# Patient Record
Sex: Male | Born: 1980 | Race: White | Hispanic: No | Marital: Married | Smoking: Former smoker
Health system: Southern US, Community
[De-identification: ages and names within clinical notes are randomized; demographics above are authoritative.]

## PROBLEM LIST (undated history)

## (undated) DIAGNOSIS — G43909 Migraine, unspecified, not intractable, without status migrainosus: Secondary | ICD-10-CM

## (undated) DIAGNOSIS — R519 Headache, unspecified: Secondary | ICD-10-CM

## (undated) DIAGNOSIS — G8929 Other chronic pain: Secondary | ICD-10-CM

## (undated) DIAGNOSIS — Z91018 Allergy to other foods: Secondary | ICD-10-CM

## (undated) DIAGNOSIS — F329 Major depressive disorder, single episode, unspecified: Secondary | ICD-10-CM

## (undated) DIAGNOSIS — J45909 Unspecified asthma, uncomplicated: Secondary | ICD-10-CM

## (undated) DIAGNOSIS — Z87448 Personal history of other diseases of urinary system: Secondary | ICD-10-CM

## (undated) DIAGNOSIS — F419 Anxiety disorder, unspecified: Secondary | ICD-10-CM

## (undated) DIAGNOSIS — H9319 Tinnitus, unspecified ear: Secondary | ICD-10-CM

## (undated) DIAGNOSIS — F1990 Other psychoactive substance use, unspecified, uncomplicated: Secondary | ICD-10-CM

## (undated) DIAGNOSIS — G4730 Sleep apnea, unspecified: Secondary | ICD-10-CM

## (undated) DIAGNOSIS — R569 Unspecified convulsions: Secondary | ICD-10-CM

## (undated) DIAGNOSIS — E663 Overweight: Secondary | ICD-10-CM

## (undated) DIAGNOSIS — F32A Depression, unspecified: Secondary | ICD-10-CM

## (undated) DIAGNOSIS — M549 Dorsalgia, unspecified: Secondary | ICD-10-CM

## (undated) DIAGNOSIS — F319 Bipolar disorder, unspecified: Secondary | ICD-10-CM

## (undated) DIAGNOSIS — J4520 Mild intermittent asthma, uncomplicated: Secondary | ICD-10-CM

## (undated) DIAGNOSIS — R0602 Shortness of breath: Secondary | ICD-10-CM

## (undated) DIAGNOSIS — R002 Palpitations: Secondary | ICD-10-CM

## (undated) DIAGNOSIS — R55 Syncope and collapse: Secondary | ICD-10-CM

## (undated) DIAGNOSIS — K219 Gastro-esophageal reflux disease without esophagitis: Secondary | ICD-10-CM

## (undated) DIAGNOSIS — T7840XA Allergy, unspecified, initial encounter: Secondary | ICD-10-CM

## (undated) HISTORY — PX: VASECTOMY: SHX75

## (undated) HISTORY — DX: Bipolar disorder, unspecified: F31.9

## (undated) HISTORY — DX: Major depressive disorder, single episode, unspecified: F32.9

## (undated) HISTORY — DX: Palpitations: R00.2

## (undated) HISTORY — DX: Gastro-esophageal reflux disease without esophagitis: K21.9

## (undated) HISTORY — DX: Sleep apnea, unspecified: G47.30

## (undated) HISTORY — DX: Headache, unspecified: R51.9

## (undated) HISTORY — PX: APPENDECTOMY: SHX54

## (undated) HISTORY — DX: Migraine, unspecified, not intractable, without status migrainosus: G43.909

## (undated) HISTORY — DX: Unspecified asthma, uncomplicated: J45.909

## (undated) HISTORY — DX: Tinnitus, unspecified ear: H93.19

## (undated) HISTORY — DX: Allergy to other foods: Z91.018

## (undated) HISTORY — DX: Syncope and collapse: R55

## (undated) HISTORY — DX: Personal history of other diseases of urinary system: Z87.448

## (undated) HISTORY — DX: Other psychoactive substance use, unspecified, uncomplicated: F19.90

## (undated) HISTORY — PX: WISDOM TOOTH EXTRACTION: SHX21

## (undated) HISTORY — DX: Mild intermittent asthma, uncomplicated: J45.20

## (undated) HISTORY — DX: Overweight: E66.3

## (undated) HISTORY — DX: Shortness of breath: R06.02

## (undated) HISTORY — DX: Unspecified convulsions: R56.9

## (undated) HISTORY — DX: Other chronic pain: G89.29

## (undated) HISTORY — DX: Depression, unspecified: F32.A

## (undated) HISTORY — PX: CHOLECYSTECTOMY: SHX55

## (undated) HISTORY — DX: Allergy, unspecified, initial encounter: T78.40XA

## (undated) HISTORY — DX: Anxiety disorder, unspecified: F41.9

## (undated) HISTORY — PX: NASAL SEPTUM SURGERY: SHX37

## (undated) HISTORY — DX: Dorsalgia, unspecified: M54.9

---

## 2000-04-29 ENCOUNTER — Ambulatory Visit (HOSPITAL_COMMUNITY): Admission: RE | Admit: 2000-04-29 | Discharge: 2000-05-01 | Payer: Self-pay | Admitting: Internal Medicine

## 2001-05-08 ENCOUNTER — Encounter: Payer: Self-pay | Admitting: Internal Medicine

## 2001-05-08 ENCOUNTER — Encounter: Admission: RE | Admit: 2001-05-08 | Discharge: 2001-05-08 | Payer: Self-pay | Admitting: Internal Medicine

## 2001-05-19 ENCOUNTER — Encounter: Payer: Self-pay | Admitting: Urology

## 2001-05-19 ENCOUNTER — Encounter: Admission: RE | Admit: 2001-05-19 | Discharge: 2001-05-19 | Payer: Self-pay | Admitting: Urology

## 2014-07-06 DIAGNOSIS — G43709 Chronic migraine without aura, not intractable, without status migrainosus: Secondary | ICD-10-CM | POA: Insufficient documentation

## 2014-07-06 DIAGNOSIS — N200 Calculus of kidney: Secondary | ICD-10-CM | POA: Insufficient documentation

## 2014-07-06 DIAGNOSIS — N201 Calculus of ureter: Secondary | ICD-10-CM | POA: Insufficient documentation

## 2014-07-06 DIAGNOSIS — J019 Acute sinusitis, unspecified: Secondary | ICD-10-CM | POA: Insufficient documentation

## 2014-07-06 DIAGNOSIS — F988 Other specified behavioral and emotional disorders with onset usually occurring in childhood and adolescence: Secondary | ICD-10-CM | POA: Insufficient documentation

## 2014-07-06 DIAGNOSIS — J309 Allergic rhinitis, unspecified: Secondary | ICD-10-CM | POA: Insufficient documentation

## 2014-07-06 DIAGNOSIS — E785 Hyperlipidemia, unspecified: Secondary | ICD-10-CM | POA: Insufficient documentation

## 2015-01-24 DIAGNOSIS — R748 Abnormal levels of other serum enzymes: Secondary | ICD-10-CM | POA: Insufficient documentation

## 2015-01-24 HISTORY — PX: ESOPHAGOGASTRODUODENOSCOPY: SHX1529

## 2015-01-24 HISTORY — PX: FLEXIBLE SIGMOIDOSCOPY: SHX1649

## 2015-01-26 DIAGNOSIS — K209 Esophagitis, unspecified without bleeding: Secondary | ICD-10-CM | POA: Insufficient documentation

## 2015-01-31 DIAGNOSIS — G43019 Migraine without aura, intractable, without status migrainosus: Secondary | ICD-10-CM | POA: Insufficient documentation

## 2015-01-31 DIAGNOSIS — K2990 Gastroduodenitis, unspecified, without bleeding: Secondary | ICD-10-CM | POA: Insufficient documentation

## 2015-03-27 DIAGNOSIS — M542 Cervicalgia: Secondary | ICD-10-CM | POA: Insufficient documentation

## 2015-04-24 DIAGNOSIS — J343 Hypertrophy of nasal turbinates: Secondary | ICD-10-CM | POA: Insufficient documentation

## 2015-04-24 DIAGNOSIS — J342 Deviated nasal septum: Secondary | ICD-10-CM | POA: Insufficient documentation

## 2016-09-03 ENCOUNTER — Other Ambulatory Visit (HOSPITAL_BASED_OUTPATIENT_CLINIC_OR_DEPARTMENT_OTHER): Payer: Self-pay

## 2016-09-03 ENCOUNTER — Other Ambulatory Visit (HOSPITAL_BASED_OUTPATIENT_CLINIC_OR_DEPARTMENT_OTHER): Payer: Self-pay | Admitting: Osteopathic Medicine

## 2016-09-03 DIAGNOSIS — M542 Cervicalgia: Secondary | ICD-10-CM

## 2016-09-09 ENCOUNTER — Ambulatory Visit (HOSPITAL_BASED_OUTPATIENT_CLINIC_OR_DEPARTMENT_OTHER)
Admission: RE | Admit: 2016-09-09 | Discharge: 2016-09-09 | Disposition: A | Discharge status: Home / Self Care | Payer: BLUE CROSS/BLUE SHIELD | Source: Ambulatory Visit | Attending: Osteopathic Medicine | Admitting: Osteopathic Medicine

## 2016-09-09 DIAGNOSIS — M542 Cervicalgia: Secondary | ICD-10-CM

## 2016-12-23 DIAGNOSIS — R569 Unspecified convulsions: Secondary | ICD-10-CM | POA: Insufficient documentation

## 2017-02-10 ENCOUNTER — Other Ambulatory Visit (HOSPITAL_BASED_OUTPATIENT_CLINIC_OR_DEPARTMENT_OTHER): Payer: Self-pay | Admitting: Physician Assistant

## 2017-02-10 DIAGNOSIS — R222 Localized swelling, mass and lump, trunk: Secondary | ICD-10-CM

## 2017-02-20 ENCOUNTER — Ambulatory Visit (HOSPITAL_BASED_OUTPATIENT_CLINIC_OR_DEPARTMENT_OTHER)
Admission: RE | Admit: 2017-02-20 | Discharge: 2017-02-20 | Disposition: A | Discharge status: Home / Self Care | Payer: Managed Care, Other (non HMO) | Source: Ambulatory Visit | Attending: Physician Assistant | Admitting: Physician Assistant

## 2017-02-20 ENCOUNTER — Ambulatory Visit (HOSPITAL_BASED_OUTPATIENT_CLINIC_OR_DEPARTMENT_OTHER): Admission: RE | Admit: 2017-02-20 | Payer: Managed Care, Other (non HMO) | Source: Ambulatory Visit

## 2017-02-20 DIAGNOSIS — R221 Localized swelling, mass and lump, neck: Secondary | ICD-10-CM | POA: Diagnosis present

## 2017-02-20 DIAGNOSIS — R222 Localized swelling, mass and lump, trunk: Secondary | ICD-10-CM

## 2017-04-01 ENCOUNTER — Other Ambulatory Visit (HOSPITAL_BASED_OUTPATIENT_CLINIC_OR_DEPARTMENT_OTHER): Payer: Self-pay | Admitting: Physician Assistant

## 2017-04-01 DIAGNOSIS — R229 Localized swelling, mass and lump, unspecified: Principal | ICD-10-CM

## 2017-04-01 DIAGNOSIS — IMO0002 Reserved for concepts with insufficient information to code with codable children: Secondary | ICD-10-CM

## 2017-04-03 ENCOUNTER — Ambulatory Visit (HOSPITAL_BASED_OUTPATIENT_CLINIC_OR_DEPARTMENT_OTHER)
Admission: RE | Admit: 2017-04-03 | Discharge: 2017-04-03 | Disposition: A | Discharge status: Home / Self Care | Payer: Managed Care, Other (non HMO) | Source: Ambulatory Visit | Attending: Physician Assistant | Admitting: Physician Assistant

## 2017-04-03 DIAGNOSIS — IMO0002 Reserved for concepts with insufficient information to code with codable children: Secondary | ICD-10-CM

## 2017-04-03 DIAGNOSIS — R229 Localized swelling, mass and lump, unspecified: Secondary | ICD-10-CM | POA: Insufficient documentation

## 2017-04-03 MED ORDER — IOPAMIDOL (ISOVUE-300) INJECTION 61%
100.0000 mL | Freq: Once | INTRAVENOUS | Status: AC | PRN
  Administered 2017-04-03: 75 mL via INTRAVENOUS

## 2018-01-08 DIAGNOSIS — R42 Dizziness and giddiness: Secondary | ICD-10-CM | POA: Diagnosis not present

## 2018-01-09 ENCOUNTER — Ambulatory Visit (INDEPENDENT_AMBULATORY_CARE_PROVIDER_SITE_OTHER): Payer: Managed Care, Other (non HMO) | Admitting: Family Medicine

## 2018-01-09 ENCOUNTER — Encounter: Payer: Self-pay | Admitting: Family Medicine

## 2018-01-09 VITALS — BP 124/80 | HR 97 | Ht 73.0 in | Wt 290.4 lb

## 2018-01-09 DIAGNOSIS — Z0001 Encounter for general adult medical examination with abnormal findings: Secondary | ICD-10-CM | POA: Insufficient documentation

## 2018-01-09 DIAGNOSIS — F339 Major depressive disorder, recurrent, unspecified: Secondary | ICD-10-CM | POA: Diagnosis not present

## 2018-01-09 DIAGNOSIS — G43709 Chronic migraine without aura, not intractable, without status migrainosus: Secondary | ICD-10-CM

## 2018-01-09 DIAGNOSIS — IMO0002 Reserved for concepts with insufficient information to code with codable children: Secondary | ICD-10-CM | POA: Insufficient documentation

## 2018-01-09 MED ORDER — VENLAFAXINE HCL 37.5 MG PO TABS: 37.5000 mg | ORAL_TABLET | Freq: Two times a day (BID) | ORAL | 0 refills | Status: DC

## 2018-01-09 MED ORDER — DULOXETINE HCL 20 MG PO CPEP: ORAL_CAPSULE | ORAL | 0 refills | Status: DC
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
## 2018-01-09 NOTE — Patient Instructions (Signed)
Health Maintenance, Male A healthy lifestyle and preventive care is important for your health and wellness. Ask your health care provider about what schedule of regular examinations is right for you. What should I know about weight and diet? Eat a Healthy Diet  Eat plenty of vegetables, fruits, whole grains, low-fat dairy products, and lean protein.  Do not eat a lot of foods high in solid fats, added sugars, or salt.  Maintain a Healthy Weight Regular exercise can help you achieve or maintain a healthy weight. You should:  Do at least 150 minutes of exercise each week. The exercise should increase your heart rate and make you sweat (moderate-intensity exercise).  Do strength-training exercises at least twice a week.  Watch Your Levels of Cholesterol and Blood Lipids  Have your blood tested for lipids and cholesterol every 5 years starting at 37 years of age. If you are at high risk for heart disease, you should start having your blood tested when you are 37 years old. You may need to have your cholesterol levels checked more often if: ? Your lipid or cholesterol levels are high. ? You are older than 37 years of age. ? You are at high risk for heart disease.  What should I know about cancer screening? Many types of cancers can be detected early and may often be prevented. Lung Cancer  You should be screened every year for lung cancer if: ? You are a current smoker who has smoked for at least 30 years. ? You are a former smoker who has quit within the past 15 years.  Talk to your health care provider about your screening options, when you should start screening, and how often you should be screened.  Colorectal Cancer  Routine colorectal cancer screening usually begins at 37 years of age and should be repeated every 5-10 years until you are 37 years old. You may need to be screened more often if early forms of precancerous polyps or small growths are found. Your health care provider  may recommend screening at an earlier age if you have risk factors for colon cancer.  Your health care provider may recommend using home test kits to check for hidden blood in the stool.  A small camera at the end of a tube can be used to examine your colon (sigmoidoscopy or colonoscopy). This checks for the earliest forms of colorectal cancer.  Prostate and Testicular Cancer  Depending on your age and overall health, your health care provider may do certain tests to screen for prostate and testicular cancer.  Talk to your health care provider about any symptoms or concerns you have about testicular or prostate cancer.  Skin Cancer  Check your skin from head to toe regularly.  Tell your health care provider about any new moles or changes in moles, especially if: ? There is a change in a mole's size, shape, or color. ? You have a mole that is larger than a pencil eraser.  Always use sunscreen. Apply sunscreen liberally and repeat throughout the day.  Protect yourself by wearing long sleeves, pants, a wide-brimmed hat, and sunglasses when outside.  What should I know about heart disease, diabetes, and high blood pressure?  If you are 89-13 years of age, have your blood pressure checked every 3-5 years. If you are 70 years of age or older, have your blood pressure checked every year. You should have your blood pressure measured twice-once when you are at a hospital or clinic, and once when  you are not at a hospital or clinic. Record the average of the two measurements. To check your blood pressure when you are not at a hospital or clinic, you can use: ? An automated blood pressure machine at a pharmacy. ? A home blood pressure monitor.  Talk to your health care provider about your target blood pressure.  If you are between 18-11 years old, ask your health care provider if you should take aspirin to prevent heart disease.  Have regular diabetes screenings by checking your fasting blood  sugar level. ? If you are at a normal weight and have a low risk for diabetes, have this test once every three years after the age of 70. ? If you are overweight and have a high risk for diabetes, consider being tested at a younger age or more often.  A one-time screening for abdominal aortic aneurysm (AAA) by ultrasound is recommended for men aged 65-75 years who are current or former smokers. What should I know about preventing infection? Hepatitis B If you have a higher risk for hepatitis B, you should be screened for this virus. Talk with your health care provider to find out if you are at risk for hepatitis B infection. Hepatitis C Blood testing is recommended for:  Everyone born from [birth year] through 1965.  Anyone with known risk factors for hepatitis C.  Sexually Transmitted Diseases (STDs)  You should be screened each year for STDs including gonorrhea and chlamydia if: ? You are sexually active and are younger than 37 years of age. ? You are older than 37 years of age and your health care provider tells you that you are at risk for this type of infection. ? Your sexual activity has changed since you were last screened and you are at an increased risk for chlamydia or gonorrhea. Ask your health care provider if you are at risk.  Talk with your health care provider about whether you are at high risk of being infected with HIV. Your health care provider may recommend a prescription medicine to help prevent HIV infection.  What else can I do?  Schedule regular health, dental, and eye exams.  Stay current with your vaccines (immunizations).  Do not use any tobacco products, such as cigarettes, chewing tobacco, and e-cigarettes. If you need help quitting, ask your health care provider.  Limit alcohol intake to no more than 2 drinks per day. One drink equals 12 ounces of beer, 5 ounces of wine, or 1 ounces of hard liquor.  Do not use street drugs.  Do not share needles.  Ask your  health care provider for help if you need support or information about quitting drugs.  Tell your health care provider if you often feel depressed.  Tell your health care provider if you have ever been abused or do not feel safe at home. This information is not intended to replace advice given to you by your health care provider. Make sure you discuss any questions you have with your health care provider. Document Released: 03/21/2008 Document Revised: 05/22/2016 Document Reviewed: 06/27/2015 Elsevier Interactive Patient Education  2018 [employer].  How to Increase Your Level of Physical Activity Getting regular physical activity is important for your overall health and well-being. Most people do not get enough exercise. There are easy ways to increase your level of physical activity, even if you have not been very active in the past or you are just starting out. Why is physical activity important? Physical activity has many  short-term and long-term health benefits. Regular exercise can:  Help you lose weight or maintain a healthy weight.  Strengthen your muscles and bones.  Boost your mood and improve self-esteem.  Reduce your risk of certain long-term (chronic) diseases, like heart disease, cancer, and diabetes.  Help you stay capable of walking and moving around (mobile) as you age.  Prevent accidents, such as falls, as you age.  Increase life expectancy.  What are the benefits of being physically active on a regular basis? In addition to improving your physical health, being physically active on most days of the week can help you in ways that you may not expect. Benefits of regular physical activity may include:  Feeling good about your body.  Being able to move around more easily and for longer periods of time without getting tired (increased stamina).  Finding new sources of fun and enjoyment.  Meeting new people who share a common interest.  Being able to fight off  illness better (enhanced immunity).  Being able to sleep better.  What can happen if I am not physically active on a regular basis? Not getting enough physical activity can lead to an unhealthy lifestyle and future health problems. This can increase your chances of:  Becoming overweight or obese.  Becoming sick.  Developing chronic illnesses, like heart disease or diabetes.  Having mental health problems, like depression or anxiety.  Having sleep problems.  Having trouble walking or getting yourself around (reduced mobility).  Injuring yourself in a fall as you get older.  What steps can I take to be more physically active?  Check with your health care provider about how to get started. Ask your health care provider what activities are safe for you.  Start out slowly. Walking or doing some simple chair exercises is a good place to start, especially if you have not been active before or for a long time.  Try to find activities that you enjoy. You are more likely to commit to an exercise routine if it does not feel like a chore.  If you have bone or joint problems, choose low-impact exercises, like walking or swimming.  Include physical activity in your everyday routine.  Invite friends or family members to exercise with you. This also will help you commit to your workout plan.  Set goals that you can work toward.  Aim for at least 150 minutes of moderate-intensity exercise each week. Examples of moderate-intensity exercise include walking or riding a bike. Where to find more information:  Centers for Disease Control and Prevention: [employer]is  President's [employer] on [employer], Sports & Nutrition www.http://villegas.org/  ChooseMyPlate: [employer] Contact a health care provider if:  You have headaches, muscle aches, or joint pain.  You feel dizzy or light-headed while exercising.  You faint.  You have  chest pain while exercising. Summary  Exercise benefits your mind and body at any age, even if you are just starting out.  If you have a chronic illness or have not been active for a while, check with your health care provider before increasing your physical activity.  Choose activities that are safe and enjoyable for you.Ask your health care provider what activities are safe for you.  Start slowly. Tell your health care provider if you have problems as you start to increase your activity level. This information is not intended to replace advice given to you by your health care provider. Make sure you discuss any questions you have with your health care provider. Document Released:  09/12/2016 Document Revised: 09/12/2016 Document Reviewed: 09/12/2016 Elsevier Interactive Patient Education  2018 [employer].  Persistent Depressive Disorder, Adult Persistent depressive disorder (PDD) is a mental health condition that causes symptoms of low-level depression for 2 years or longer. It may also be called long-term (chronic) depression or dysthymia. PDD may include episodes of more severe depression that last for about 2 weeks (major depressive disorder or MDD). PDD can affect the way you think, feel, and sleep. This condition may also affect your relationships. You may be more likely to get sick if you have PDD. What are the causes? The exact cause of this condition is not known. PDD is most likely caused by a combination of things, which may include:  Genetic factors. These are traits that are passed along from parent to child.  Individual factors. Your personality, your behavior, and the way you handle your thoughts and feelings may contribute to PDD. This includes personality traits and behaviors learned from others.  Physical factors, such as: ? Differences in the part of your brain that controls emotion. This part of your brain may be different than it is in people who do not have  PDD. ? Long-term (chronic) medical or psychiatric illnesses.  Social factors. Traumatic experiences or major life changes may play a role in the development of PDD.  What increases the risk? This condition is more likely to develop in women. The following factors may make you more likely to develop PDD:  A family history of depression.  Abnormally low levels of certain brain chemicals.  Traumatic events in childhood, especially abuse or the loss of a parent.  Being under a lot of stress, or long-term stress, especially from upsetting life experiences or losses.  A history of: ? Chronic physical illness. ? Other mental health disorders. ? Substance abuse.  Poor living conditions.  Experiencing social exclusion or discrimination on a regular basis.  What are the signs or symptoms? Symptoms of this condition occur for most of the day, and may include:  Fatigue or low energy.  Eating too much or too little.  Sleeping too much or too little.  Restlessness or agitation.  Feelings of hopelessness.  Feeling worthless or guilty.  Anxiety.  Poor concentration or difficulty making decisions.  Low self-esteem.  Negative outlook.  Inability to have fun or experience pleasure.  Social withdrawal.  Unexplained physical complaints.  Irritability.  Aggressive behavior or anger.  How is this diagnosed? This condition may be diagnosed based on:  Your symptoms.  Your medical history, including your mental health history. This may involve tests to evaluate your mental health. You may be asked questions about your lifestyle, including any drug and alcohol use, and how long you have had symptoms of PDD.  A physical exam.  Blood tests to rule out other conditions.  You may be diagnosed with PDD if you have had a depressed mood for 2 years or longer, as well as other symptoms of depression. How is this treated? This condition is usually treated by mental health  professionals, such as psychologists, psychiatrists, and clinical social workers. You may need more than one type of treatment. Treatment may include:  Psychotherapy. This is also called talk therapy or counseling. Types of psychotherapy include: ? Cognitive behavioral therapy (CBT). This type of therapy teaches you to recognize unhealthy feelings, thoughts, and behaviors, and replace them with positive thoughts and actions. ? Interpersonal therapy (IPT). This helps you to improve the way you relate to and communicate with  others. ? Family therapy. This treatment includes members of your family.  Medicine to treat anxiety and depression, or to help you control certain emotions and behaviors.  Lifestyle changes, such as: ? Limiting alcohol and drug use. ? Exercising regularly. ? Getting plenty of sleep. ? Making healthy eating choices. ? Spending more time outdoors.  Follow these instructions at home: Activity  Return to your normal activities as told by your health care provider.  Exercise regularly and spend time outdoors as told by your health care provider. General instructions  Take over-the-counter and prescription medicines only as told by your health care provider.  Do not drink alcohol. If you drink alcohol, limit your alcohol intake to no more than 1 drink a day for nonpregnant women and 2 drinks a day for men. One drink equals 12 oz of beer, 5 oz of wine, or 1 oz of hard liquor. Alcohol can affect any antidepressant medicines you are taking. Talk to your health care provider about your alcohol use.  Eat a healthy diet and get plenty of sleep.  Find activities that you enjoy doing, and make time to do them.  Consider joining a support group. Your health care provider may be able to recommend a support group.  Keep all follow-up visits as told by your health care provider. This is important. Where to find more information: [employer] on Mental  Illness  www.nami.org  U.S. National Institute of Mental Health  https://carter.com/  National Suicide Prevention Lifeline  1-800-273-TALK (216)084-5823). This is free, 24-hour help.  Contact a health care provider if:  Your symptoms get worse.  You develop new symptoms.  You have trouble sleeping or doing your daily activities. Get help right away if:  You self-harm.  You have serious thoughts about hurting yourself or others.  You see, hear, taste, smell, or feel things that are not present (hallucinate). This information is not intended to replace advice given to you by your health care provider. Make sure you discuss any questions you have with your health care provider. Document Released: 09/09/2012 Document Revised: 05/23/2016 Document Reviewed: 04/06/2016 Elsevier Interactive Patient Education  2018 [place] 18-39 Years, Male Preventive care refers to lifestyle choices and visits with your health care provider that can promote health and wellness. What does preventive care include?  A yearly physical exam. This is also called an annual well check.  Dental exams once or twice a year.  Routine eye exams. Ask your health care provider how often you should have your eyes checked.  Personal lifestyle choices, including: ? Daily care of your teeth and gums. ? Regular physical activity. ? Eating a healthy diet. ? Avoiding tobacco and drug use. ? Limiting alcohol use. ? Practicing safe sex. What happens during an annual well check? The services and screenings done by your health care provider during your annual well check will depend on your age, overall health, lifestyle risk factors, and family history of disease. Counseling Your health care provider may ask you questions about your:  Alcohol use.  Tobacco use.  Drug use.  Emotional well-being.  Home and relationship well-being.  Sexual activity.  Eating habits.  Work and work  Statistician.  Screening You may have the following tests or measurements:  Height, weight, and BMI.  Blood pressure.  Lipid and cholesterol levels. These may be checked every 5 years starting at age 4.  Diabetes screening. This is done by checking your blood sugar (glucose) after you have not  eaten for a while (fasting).  Skin check.  Hepatitis C blood test.  Hepatitis B blood test.  Sexually transmitted disease (STD) testing.  Discuss your test results, treatment options, and if necessary, the need for more tests with your health care provider. Vaccines Your health care provider may recommend certain vaccines, such as:  Influenza vaccine. This is recommended every year.  Tetanus, diphtheria, and acellular pertussis (Tdap, Td) vaccine. You may need a Td booster every 10 years.  Varicella vaccine. You may need this if you have not been vaccinated.  HPV vaccine. If you are 74 or younger, you may need three doses over 6 months.  Measles, mumps, and rubella (MMR) vaccine. You may need at least one dose of MMR.You may also need a second dose.  Pneumococcal 13-valent conjugate (PCV13) vaccine. You may need this if you have certain conditions and have not been vaccinated.  Pneumococcal polysaccharide (PPSV23) vaccine. You may need one or two doses if you smoke cigarettes or if you have certain conditions.  Meningococcal vaccine. One dose is recommended if you are age 21-21 years and a first-year college student living in a residence hall, or if you have one of several medical conditions. You may also need additional booster doses.  Hepatitis A vaccine. You may need this if you have certain conditions or if you travel or work in places where you may be exposed to hepatitis A.  Hepatitis B vaccine. You may need this if you have certain conditions or if you travel or work in places where you may be exposed to hepatitis B.  Haemophilus influenzae type b (Hib) vaccine. You may need  this if you have certain risk factors.  Talk to your health care provider about which screenings and vaccines you need and how often you need them. This information is not intended to replace advice given to you by your health care provider. Make sure you discuss any questions you have with your health care provider. Document Released: 11/19/2001 Document Revised: 06/12/2016 Document Reviewed: 07/25/2015 Elsevier Interactive Patient Education  [employer].

## 2018-01-14 ENCOUNTER — Other Ambulatory Visit: Payer: Self-pay

## 2018-01-14 ENCOUNTER — Encounter: Payer: Self-pay | Admitting: Family Medicine

## 2018-01-14 MED ORDER — DULOXETINE HCL 30 MG PO CPEP: ORAL_CAPSULE | ORAL | 0 refills | Status: DC

## 2018-01-29 ENCOUNTER — Ambulatory Visit (INDEPENDENT_AMBULATORY_CARE_PROVIDER_SITE_OTHER): Payer: Managed Care, Other (non HMO) | Admitting: Family Medicine

## 2018-01-29 ENCOUNTER — Encounter: Payer: Self-pay | Admitting: Family Medicine

## 2018-01-29 VITALS — BP 118/80 | HR 76 | Wt 293.4 lb

## 2018-01-29 DIAGNOSIS — L739 Follicular disorder, unspecified: Secondary | ICD-10-CM | POA: Diagnosis not present

## 2018-01-29 DIAGNOSIS — J4520 Mild intermittent asthma, uncomplicated: Secondary | ICD-10-CM | POA: Diagnosis not present

## 2018-01-29 DIAGNOSIS — Z23 Encounter for immunization: Secondary | ICD-10-CM | POA: Diagnosis not present

## 2018-01-29 DIAGNOSIS — M654 Radial styloid tenosynovitis [de Quervain]: Secondary | ICD-10-CM | POA: Diagnosis not present

## 2018-01-29 HISTORY — DX: Mild intermittent asthma, uncomplicated: J45.20

## 2018-01-29 MED ORDER — DICLOFENAC SODIUM 1 % TD GEL: 2.0000 g | Freq: Four times a day (QID) | TRANSDERMAL | 1 refills | Status: DC

## 2018-01-29 MED ORDER — ALBUTEROL SULFATE HFA 108 (90 BASE) MCG/ACT IN AERS: 2.0000 | INHALATION_SPRAY | Freq: Four times a day (QID) | RESPIRATORY_TRACT | 1 refills | Status: DC | PRN

## 2018-01-29 NOTE — Progress Notes (Signed)
Tsosie Billing. - 37 y.o. male MRN [id number]  Date of birth: March 31, 1981  Subjective Chief Complaint  Patient presents with  . Acute Visit    PATIENT WOULD LIKE TDAP AND TO DISCUSS OTHER ISSUES WITH PROVIDER    HPI  Lemuel Boodram. is a 37 y.o. male with history of depression and anxiety, migraines, asthma and seizures here today for the following  -Thumb pain:  Complaint of thumb pain on R hand.  He is right handed but denies any recent injury or overuse.  He has had prior injuries to the thumb.  Pain located at base of the thumb and into distal wrist.  Has not tried anything for treatment.  Worse with flexion/adduction of thumb.   -Asthma:  History of exercise induced asthma however has noticed that recently he has had some increased tightness in his chest with sob feeling and wheezing.  This seems to be worse during times that his seasonal allergies are worse.  He has tried his daughters inhaler and this has provided relief.  He denies chest pain, palpitations or exertional dyspnea.    He also requests updated Tdap as his cousin recently had a baby and is requiring all family members to have pertussis vaccine prior to seeing child.   ROS:  ROS was completed and negative except as noted per HPI.    Allergies  Allergen Reactions  . 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 Escitalopram Oxalate Other (See Comments)  . Hydrocodone Other (See Comments)    Rebound headaches  . Latex Rash  . Morphine Other (See Comments)    Severe headaches Severe migraine   . Mushroom Extract Complex Other (See Comments)  . Other Other (See Comments)    All codeine medication increase migraine symptoms  . Sulfa Antibiotics Other (See Comments)    Causes glaucoma  VISION LOSS     Past Medical History:  Diagnosis Date  . Allergy   . Asthma   . Depression   . GERD (gastroesophageal reflux disease)   . Seizures ([place])     Past  Surgical History:  Procedure Laterality Date  . APPENDECTOMY    . CHOLECYSTECTOMY      Social History   Socioeconomic History  . Marital status: Married    Spouse name: Not on file  . Number of children: Not on file  . Years of education: Not on file  . Highest education level: Not on file  Occupational History  . Not on file  Social Needs  . Financial resource strain: Not on file  . Food insecurity:    Worry: Not on file    Inability: Not on file  . Transportation needs:    Medical: Not on file    Non-medical: Not on file  Tobacco Use  . Smoking status: Former Research scientist (life sciences)  . Smokeless tobacco: Never Used  Substance and Sexual Activity  . Alcohol use: Not on file  . Drug use: Not on file  . Sexual activity: Not on file  Lifestyle  . Physical activity:    Days per week: Not on file    Minutes per session: Not on file  . Stress: Not on file  Relationships  . Social connections:    Talks on phone: Not on file    Gets together: Not on file    Attends religious service: Not on file    Active member of club or  organization: Not on file    Attends meetings of clubs or organizations: Not on file    Relationship status: Not on file  Other Topics Concern  . Not on file  Social History Narrative  . Not on file    Family History  Problem Relation Age of Onset  . Arthritis Mother   . Diabetes Mother   . Hearing loss Mother   . Hyperlipidemia Mother   . Hypertension Mother   . Kidney disease Mother   . Alcohol abuse Father   . Arthritis Father   . Asthma Father   . Cancer Father   . Depression Father   . Drug abuse Father   . Hearing loss Father   . Heart attack Father   . Heart disease Father   . Hyperlipidemia Father   . Hypertension Father   . Kidney disease Father   . Stroke Father   . Stroke Sister   . Depression Sister   . Diabetes Sister   . Hearing loss Sister   . Kidney disease Sister   . Learning disabilities Sister   . Mental illness Sister   .  Asthma Daughter   . Arthritis Maternal Grandmother   . Cancer Maternal Grandmother   . Diabetes Maternal Grandmother   . Hypertension Maternal Grandmother   . Kidney disease Maternal Grandmother   . Stroke Maternal Grandmother   . Arthritis Maternal Grandfather   . Cancer Maternal Grandfather   . Heart attack Maternal Grandfather   . Arthritis Paternal Grandmother   . Alcohol abuse Paternal Grandmother   . Cancer Paternal Grandmother   . COPD Paternal Grandmother   . Diabetes Paternal Grandmother   . Heart attack Paternal Grandmother   . Hearing loss Paternal Grandmother   . Hypertension Paternal Grandmother   . Arthritis Paternal Grandfather   . Alcohol abuse Paternal Grandfather   . Cancer Paternal Grandfather   . COPD Paternal Grandfather   . Diabetes Paternal Grandfather   . Heart attack Paternal Grandfather   . Heart disease Paternal Grandfather   . Hearing loss Paternal Grandfather   . Hypertension Paternal Grandfather   . Stroke Paternal Grandfather   . Birth defects Sister     Health Maintenance  Topic Date Due  . HIV Screening  01/09/1996  . TETANUS/TDAP  01/09/2000  . INFLUENZA VACCINE  05/07/2018    ----------------------------------------------------------------------------------------------------------------------------------------------------- Physical Exam BP 118/80 (BP Location: Right Arm, Patient Position: Sitting, Cuff Size: Large)   Pulse 76   Wt 293 lb 6.4 oz (133.1 kg)   BMI 38.71 kg/m   Physical Exam  Constitutional: He is oriented to person, place, and time. He appears well-nourished. No distress.  HENT:  Head: Normocephalic and atraumatic.  Mouth/Throat: Oropharynx is clear and moist.  Eyes: No scleral icterus.  Neck: Neck supple.  Cardiovascular: Normal rate, regular rhythm and normal heart sounds.  Pulmonary/Chest: Effort normal and breath sounds normal. No respiratory distress. He has no wheezes.  Musculoskeletal:  TTP without  swelling or edema at base of R thumb.  ROM is good but with some pain.  +Finkelstein test  Lymphadenopathy:    He has no cervical adenopathy.  Neurological: He is alert and oriented to person, place, and time.  Psychiatric: He has a normal mood and affect. His behavior is normal.    ----------------------------------------------------------------------------------------------------------------------------------------------------- Assessment and Plan  De Quervain's tenosynovitis, right Recommend resting thumb Ice as needed Rx for diclofenac gel   Mild intermittent asthma Increased symptoms especially during times of  high pollen Relief with albuterol previously, will prescribe.

## 2018-01-29 NOTE — Assessment & Plan Note (Signed)
Increased symptoms especially during times of high pollen Relief with albuterol previously, will prescribe.

## 2018-01-29 NOTE — Assessment & Plan Note (Signed)
Recommend resting thumb Ice as needed Rx for diclofenac gel

## 2018-01-29 NOTE — Patient Instructions (Signed)
De Quervain Tenosynovitis  Tendons attach muscles to bones. They also help with joint movements. When tendons become irritated or swollen, it is called tendinitis.  The extensor pollicis brevis (EPB) tendon connects the EPB muscle to a bone that is near the base of the thumb. The EPB muscle helps to straighten and extend the thumb. De Quervain tenosynovitis is a condition in which the EPB tendon lining (sheath) becomes irritated, thickened, and swollen. This condition is sometimes called stenosing tenosynovitis. This condition causes pain on the thumb side of the back of the wrist.  What are the causes?  Causes of this condition include:   Activities that repeatedly cause your thumb and wrist to extend.   A sudden increase in activity or change in activity that affects your wrist.    What increases the risk?  This condition is more likely to develop in:   Females.   People who have diabetes.   Women who have recently given birth.   People who are over 40 years of age.   People who do activities that involve repeated hand and wrist motions, such as tennis, racquetball, volleyball, gardening, and taking care of children.   People who do heavy labor.   People who have poor wrist strength and flexibility.   People who do not warm up properly before activities.    What are the signs or symptoms?  Symptoms of this condition include:   Pain or tenderness over the thumb side of the back of the wrist when your thumb and wrist are not moving.   Pain that gets worse when you straighten your thumb or extend your thumb or wrist.   Pain when the injured area is touched.   Locking or catching of the thumb joint while you bend and straighten your thumb.   Decreased thumb motion due to pain.   Swelling over the affected area.    How is this diagnosed?  This condition is diagnosed with a medical history and physical exam. Your health care provider will ask for details about your injury and ask about your  symptoms.  How is this treated?  Treatment may include the use of icing and medicines to reduce pain and swelling. You may also be advised to wear a splint or brace to limit your thumb and wrist motion. In less severe cases, treatment may also include working with a physical therapist to strengthen your wrist and calm the irritation around your EPB tendon sheath. In severe cases, surgery may be needed.  Follow these instructions at home:  If you have a splint or brace:   Wear it as told by your health care provider. Remove it only as told by your health care provider.   Loosen the splint or brace if your fingers become numb and tingle, or if they turn cold and blue.   Keep the splint or brace clean and dry.  Managing pain, stiffness, and swelling   If directed, apply ice to the injured area.  ? Put ice in a plastic bag.  ? Place a towel between your skin and the bag.  ? Leave the ice on for 20 minutes, 2-3 times per day.   Move your fingers often to avoid stiffness and to lessen swelling.   Raise (elevate) the injured area above the level of your heart while you are sitting or lying down.  General instructions   Return to your normal activities as told by your health care provider. Ask your health care provider   what activities are safe for you.   Take over-the-counter and prescription medicines only as told by your health care provider.   Keep all follow-up visits as told by your health care provider. This is important.   Do not drive or operate heavy machinery while taking prescription pain medicine.  Contact a health care provider if:   Your pain, tenderness, or swelling gets worse, even if you have had treatment.   You have numbness or tingling in your wrist, hand, or fingers on the injured side.  This information is not intended to replace advice given to you by your health care provider. Make sure you discuss any questions you have with your health care provider.  Document Released: 09/23/2005  Document Revised: 02/29/2016 Document Reviewed: 11/29/2014  Elsevier Interactive Patient Education  2018 [employer]

## 2018-02-01 ENCOUNTER — Encounter: Payer: Self-pay | Admitting: Family Medicine

## 2018-02-08 ENCOUNTER — Other Ambulatory Visit: Payer: Self-pay | Admitting: Family Medicine

## 2018-02-08 DIAGNOSIS — F339 Major depressive disorder, recurrent, unspecified: Secondary | ICD-10-CM

## 2018-02-26 DIAGNOSIS — M542 Cervicalgia: Secondary | ICD-10-CM | POA: Diagnosis not present

## 2018-02-26 DIAGNOSIS — G43709 Chronic migraine without aura, not intractable, without status migrainosus: Secondary | ICD-10-CM | POA: Diagnosis not present

## 2018-03-13 DIAGNOSIS — H832X3 Labyrinthine dysfunction, bilateral: Secondary | ICD-10-CM | POA: Diagnosis not present

## 2018-03-14 ENCOUNTER — Other Ambulatory Visit: Payer: Self-pay | Admitting: Family Medicine

## 2018-03-27 ENCOUNTER — Ambulatory Visit (HOSPITAL_COMMUNITY): Payer: Medicare Other | Admitting: Psychiatry

## 2018-04-03 ENCOUNTER — Encounter (HOSPITAL_COMMUNITY): Payer: Self-pay | Admitting: Psychiatry

## 2018-04-03 ENCOUNTER — Ambulatory Visit (INDEPENDENT_AMBULATORY_CARE_PROVIDER_SITE_OTHER): Payer: 59 | Admitting: Psychiatry

## 2018-04-03 ENCOUNTER — Other Ambulatory Visit: Payer: Self-pay | Admitting: Family Medicine

## 2018-04-03 VITALS — BP 135/92 | HR 92 | Ht 73.5 in | Wt 292.0 lb

## 2018-04-03 DIAGNOSIS — F331 Major depressive disorder, recurrent, moderate: Secondary | ICD-10-CM

## 2018-04-03 DIAGNOSIS — F411 Generalized anxiety disorder: Secondary | ICD-10-CM | POA: Diagnosis not present

## 2018-04-03 DIAGNOSIS — F339 Major depressive disorder, recurrent, unspecified: Secondary | ICD-10-CM

## 2018-04-03 MED ORDER — LAMOTRIGINE 25 MG PO TABS: ORAL_TABLET | ORAL | 1 refills | Status: DC

## 2018-04-03 MED ORDER — VENLAFAXINE HCL 37.5 MG PO TABS: 37.5000 mg | ORAL_TABLET | Freq: Two times a day (BID) | ORAL | 1 refills | Status: DC
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
## 2018-04-03 NOTE — Telephone Encounter (Signed)
SS-Plz see refill req/thx dmf

## 2018-04-16 DIAGNOSIS — H832X3 Labyrinthine dysfunction, bilateral: Secondary | ICD-10-CM | POA: Diagnosis not present

## 2018-04-16 DIAGNOSIS — G43111 Migraine with aura, intractable, with status migrainosus: Secondary | ICD-10-CM | POA: Diagnosis not present

## 2018-04-24 DIAGNOSIS — N200 Calculus of kidney: Secondary | ICD-10-CM | POA: Diagnosis not present

## 2018-04-30 DIAGNOSIS — H832X3 Labyrinthine dysfunction, bilateral: Secondary | ICD-10-CM | POA: Diagnosis not present

## 2018-04-30 DIAGNOSIS — G43111 Migraine with aura, intractable, with status migrainosus: Secondary | ICD-10-CM | POA: Diagnosis not present

## 2018-05-07 ENCOUNTER — Ambulatory Visit (HOSPITAL_COMMUNITY): Payer: 59 | Admitting: Psychiatry

## 2018-05-08 DIAGNOSIS — H832X3 Labyrinthine dysfunction, bilateral: Secondary | ICD-10-CM | POA: Diagnosis not present

## 2018-05-08 DIAGNOSIS — G43111 Migraine with aura, intractable, with status migrainosus: Secondary | ICD-10-CM | POA: Diagnosis not present

## 2018-05-21 ENCOUNTER — Ambulatory Visit (HOSPITAL_COMMUNITY): Payer: Self-pay | Admitting: Psychiatry

## 2018-07-27 ENCOUNTER — Encounter: Payer: Self-pay | Admitting: Family Medicine

## 2018-07-27 ENCOUNTER — Ambulatory Visit (INDEPENDENT_AMBULATORY_CARE_PROVIDER_SITE_OTHER): Payer: Medicare Other | Admitting: Family Medicine

## 2018-07-27 VITALS — BP 124/80 | HR 83 | Ht 73.5 in | Wt 297.0 lb

## 2018-07-27 DIAGNOSIS — F339 Major depressive disorder, recurrent, unspecified: Secondary | ICD-10-CM | POA: Diagnosis not present

## 2018-07-27 DIAGNOSIS — Z0001 Encounter for general adult medical examination with abnormal findings: Secondary | ICD-10-CM | POA: Diagnosis not present

## 2018-07-27 DIAGNOSIS — Z23 Encounter for immunization: Secondary | ICD-10-CM

## 2018-07-27 LAB — COMPREHENSIVE METABOLIC PANEL
ALK PHOS: 77 U/L (ref 39–117)
ALT: 69 U/L — ABNORMAL HIGH (ref 0–53)
AST: 43 U/L — AB (ref 0–37)
Albumin: 4.6 g/dL (ref 3.5–5.2)
BUN: 11 mg/dL (ref 6–23)
CHLORIDE: 102 meq/L (ref 96–112)
CO2: 27 mEq/L (ref 19–32)
Calcium: 10 mg/dL (ref 8.4–10.5)
Creatinine, Ser: 0.88 mg/dL (ref 0.40–1.50)
GFR: 103.26 mL/min (ref >60.00)
GLUCOSE: 111 mg/dL — AB (ref 70–99)
POTASSIUM: 4.2 meq/L (ref 3.5–5.1)
SODIUM: 138 meq/L (ref 135–145)
TOTAL PROTEIN: 7.4 g/dL (ref 6.0–8.3)
Total Bilirubin: 0.4 mg/dL (ref 0.2–1.2)

## 2018-07-27 LAB — URINALYSIS, ROUTINE W REFLEX MICROSCOPIC
Bilirubin Urine: NEGATIVE
HGB URINE DIPSTICK: NEGATIVE
Ketones, ur: NEGATIVE
Leukocytes, UA: NEGATIVE
NITRITE: NEGATIVE
RBC / HPF: NONE SEEN (ref 0–?)
Specific Gravity, Urine: 1.02 (ref 1.000–1.030)
Total Protein, Urine: NEGATIVE
Urine Glucose: NEGATIVE
Urobilinogen, UA: 0.2 (ref 0.0–1.0)
pH: 6 (ref 5.0–8.0)

## 2018-07-27 LAB — CBC
HEMATOCRIT: 47.2 % (ref 39.0–52.0)
Hemoglobin: 15.8 g/dL (ref 13.0–17.0)
MCHC: 33.4 g/dL (ref 30.0–36.0)
MCV: 83.6 fl (ref 78.0–100.0)
Platelets: 284 10*3/uL (ref 150.0–400.0)
RBC: 5.64 Mil/uL (ref 4.22–5.81)
RDW: 14.1 % (ref 11.5–15.5)
WBC: 9.7 10*3/uL (ref 4.0–10.5)

## 2018-07-27 LAB — TSH: TSH: 4.55 u[IU]/mL — ABNORMAL HIGH (ref 0.35–4.50)

## 2018-07-27 LAB — LIPID PANEL
CHOLESTEROL: 207 mg/dL — AB (ref 0–200)
HDL: 43.2 mg/dL (ref >39.00)
NonHDL: 163.77
TRIGLYCERIDES: 325 mg/dL — AB (ref 0.0–149.0)
Total CHOL/HDL Ratio: 5
VLDL: 65 mg/dL — ABNORMAL HIGH (ref 0.0–40.0)

## 2018-07-27 LAB — LDL CHOLESTEROL, DIRECT: Direct LDL: 148 mg/dL

## 2018-07-27 MED ORDER — VENLAFAXINE HCL 75 MG PO TABS: 75.0000 mg | ORAL_TABLET | Freq: Two times a day (BID) | ORAL | 1 refills | Status: DC

## 2018-07-27 NOTE — Progress Notes (Addendum)
Subjective:  Patient ID: Rodney Cherry., male    DOB: [birth year]  Age: 37 y.o. MRN: [id number]  CC: Medication Refill   HPI Rodney Cherry. presents for follow-up of his depression and for physical exam.  Patient did see behavior health and a psychiatrist.  I broke the glass on the note.  Psychiatry had started patient on Lamictal as well.  Patient discontinued the Lamictal secondary to rash she told me.  He has decided not to follow-up with psychiatry at this time.  However he is seeing a Social worker.  He feels as though the Effexor is helping.  He is also seeing neurology for his chronic migraines.  Emgality has helped him but he continues to suffer with chronic migraines.  Patient does not smoke or use illicit drugs.  He rarely drinks alcohol.  His exercise is limited due to the frequency of his migraines.  Patient is disabled secondary to his migraines.  His parents health history's were reviewed with him.  Outpatient Medications Prior to Visit  Medication Sig Dispense Refill  . albuterol (PROVENTIL HFA;VENTOLIN HFA) 108 (90 Base) MCG/ACT inhaler Inhale 2 puffs into the lungs every 6 (six) hours as needed for wheezing or shortness of breath. 1 Inhaler 1  . diclofenac sodium (VOLTAREN) 1 % GEL Apply 2 g topically 4 (four) times daily. 100 g 1  . eletriptan (RELPAX) 40 MG tablet TAKE ONE TABLET (40 MG DOSE) BY MOUTH EVERY 2 (TWO) HOURS AS NEEDED (MAX 2/24HOURS).  5  . esomeprazole (NEXIUM) 40 MG capsule Take 40 mg by mouth daily at 12 noon.    Marland Kitchen Galcanezumab-gnlm (EMGALITY) 120 MG/ML SOAJ Inject into the skin.    . promethazine (PHENERGAN) 25 MG tablet Take 25 mg by mouth every 6 (six) hours as needed for nausea or vomiting.    . lamoTRIgine (LAMICTAL) 25 MG tablet Take one tab daily for one week and than 2 tab daily 60 tablet 1  . venlafaxine (EFFEXOR) 37.5 MG tablet Take 1 tablet (37.5 mg total) by mouth 2 (two) times daily. 60 tablet 1   No facility-administered medications prior to visit.      ROS Review of Systems  Constitutional: Negative.   HENT: Negative.   Eyes: Negative for photophobia and visual disturbance.  Cardiovascular: Negative.   Gastrointestinal: Negative.   Endocrine: Negative for polyphagia and polyuria.  Genitourinary: Negative.   Musculoskeletal: Negative for gait problem and joint swelling.  Skin: Negative for pallor and wound.  Allergic/Immunologic: Negative for immunocompromised state.  Neurological: Positive for headaches. Negative for light-headedness.  Hematological: Does not bruise/bleed easily.  Psychiatric/Behavioral: Positive for dysphoric mood. Negative for self-injury and suicidal ideas.   Depression screen [facility] 2/9 07/27/2018 01/09/2018  Decreased Interest 1 3  Down, Depressed, Hopeless 2 2  PHQ - 2 Score 3 5  Altered sleeping 2 3  Tired, decreased energy 2 3  Change in appetite 2 3  Feeling bad or failure about yourself  2 3  Trouble concentrating 2 -  Moving slowly or fidgety/restless 1 2  Suicidal thoughts 1 2  PHQ-9 Score 15 21     Objective:  BP 124/80   Pulse 83   Ht 6' 1.5" (1.867 m)   Wt 297 lb (134.7 kg)   SpO2 98%   BMI 38.65 kg/m   BP Readings from Last 3 Encounters:  07/27/18 124/80  04/03/18 (!) 135/92  01/29/18 118/80    Wt Readings from Last 3 Encounters:  07/27/18 297 lb (  134.7 kg)  04/03/18 292 lb (132.5 kg)  01/29/18 293 lb 6.4 oz (133.1 kg)    Physical Exam  Constitutional: He is oriented to person, place, and time. He appears well-developed and well-nourished. No distress.  HENT:  Head: Normocephalic and atraumatic.  Right Ear: External ear normal.  Left Ear: External ear normal.  Mouth/Throat: No oropharyngeal exudate.  Eyes: Pupils are equal, round, and reactive to light. Conjunctivae and EOM are normal. Right eye exhibits no discharge. Left eye exhibits no discharge. No scleral icterus.  Neck: Neck supple. No JVD present. No tracheal deviation present. No thyromegaly present.    Cardiovascular: Normal rate, regular rhythm and normal heart sounds.  Pulmonary/Chest: Effort normal and breath sounds normal.  Abdominal: Bowel sounds are normal.  Lymphadenopathy:    He has no cervical adenopathy.  Neurological: He is alert and oriented to person, place, and time.  Skin: Skin is warm and dry. He is not diaphoretic.  Psychiatric: He has a normal mood and affect. His behavior is normal.    No results found for: WBC, HGB, HCT, PLT, GLUCOSE, CHOL, TRIG, HDL, LDLDIRECT, LDLCALC, ALT, AST, NA, K, CL, CREATININE, BUN, CO2, TSH, PSA, INR, GLUF, HGBA1C, MICROALBUR  Ct Soft Tissue Neck W Contrast  Result Date: 04/04/2017 CLINICAL DATA:  37 y/o M; left supraclavicular mass groin over 2 months. EXAM: CT NECK WITH CONTRAST TECHNIQUE: Multidetector CT imaging of the neck was performed using the standard protocol following the bolus administration of intravenous contrast. CONTRAST:  66mL ISOVUE-300 IOPAMIDOL (ISOVUE-300) INJECTION 61% COMPARISON:  02/20/2017 soft tissue ultrasound. FINDINGS: Pharynx and larynx: Normal. No mass or swelling. Salivary glands: No inflammation, mass, or stone. Thyroid: Normal. Lymph nodes: None enlarged or abnormal density. Vascular: Negative. Limited intracranial: Negative. Visualized orbits: Negative. Mastoids and visualized paranasal sinuses: Clear. Skeleton: No acute or aggressive process. Upper chest: Negative. Other: Asymmetry in supraclavicular fat, with the left more voluminous than the right. No soft tissue mass or inflammatory changes. No encapsulated lipoma identified. The finding is subtle and best appreciated on coronal image 72. 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 No mass or inflammatory process identified. Otherwise unremarkable CT of the neck with contrast. Electronically Signed   By: Kristine Garbe M.D.   On: 04/04/2017 04:33    Assessment & Plan:   Yunus was seen  today for medication refill.  Diagnoses and all orders for this visit:  Depression, recurrent ([place]) -     CBC -     TSH -     venlafaxine (EFFEXOR) 75 MG tablet; Take 1 tablet (75 mg total) by mouth 2 (two) times daily with a meal.  Encounter for health maintenance examination with abnormal findings -     CBC -     Comprehensive metabolic panel -     TSH -     Urinalysis, Routine w reflex microscopic -     Lipid panel  Need for influenza vaccination -     Flu Vaccine QUAD 36+ mos IM   I have discontinued [employer]. Cecil Cobbs. "James"'s lamoTRIgine and venlafaxine. I am also having him start on venlafaxine. Additionally, I am having him maintain his promethazine, diclofenac sodium, albuterol, Galcanezumab-gnlm, eletriptan, and esomeprazole.  Meds ordered this encounter  Medications  . venlafaxine (EFFEXOR) 75 MG tablet    Sig: Take 1 tablet (75 mg total) by mouth 2 (two) times daily with a meal.    Dispense:  60 tablet  Refill:  1   Fasting labs drawn for the patient today.  Have increased the Effexor to 75 mg twice daily.  Patient will follow-up in a month.  Encouraged continue talking therapy with his counselor.  Multiple abnormal labs. We will discuss next visit.   Follow-up: Return in about 1 month (around 08/27/2018).  Libby Maw, MD

## 2018-07-27 NOTE — Patient Instructions (Signed)
Health Maintenance, Male A healthy lifestyle and preventive care is important for your health and wellness. Ask your health care provider about what schedule of regular examinations is right for you. What should I know about weight and diet? Eat a Healthy Diet  Eat plenty of vegetables, fruits, whole grains, low-fat dairy products, and lean protein.  Do not eat a lot of foods high in solid fats, added sugars, or salt.  Maintain a Healthy Weight Regular exercise can help you achieve or maintain a healthy weight. You should:  Do at least 150 minutes of exercise each week. The exercise should increase your heart rate and make you sweat (moderate-intensity exercise).  Do strength-training exercises at least twice a week.  Watch Your Levels of Cholesterol and Blood Lipids  Have your blood tested for lipids and cholesterol every 5 years starting at 37 years of age. If you are at high risk for heart disease, you should start having your blood tested when you are 37 years old. You may need to have your cholesterol levels checked more often if: ? Your lipid or cholesterol levels are high. ? You are older than 37 years of age. ? You are at high risk for heart disease.  What should I know about cancer screening? Many types of cancers can be detected early and may often be prevented. Lung Cancer  You should be screened every year for lung cancer if: ? You are a current smoker who has smoked for at least 30 years. ? You are a former smoker who has quit within the past 15 years.  Talk to your health care provider about your screening options, when you should start screening, and how often you should be screened.  Colorectal Cancer  Routine colorectal cancer screening usually begins at 37 years of age and should be repeated every 5-10 years until you are 37 years old. You may need to be screened more often if early forms of precancerous polyps or small growths are found. Your health care provider  may recommend screening at an earlier age if you have risk factors for colon cancer.  Your health care provider may recommend using home test kits to check for hidden blood in the stool.  A small camera at the end of a tube can be used to examine your colon (sigmoidoscopy or colonoscopy). This checks for the earliest forms of colorectal cancer.  Prostate and Testicular Cancer  Depending on your age and overall health, your health care provider may do certain tests to screen for prostate and testicular cancer.  Talk to your health care provider about any symptoms or concerns you have about testicular or prostate cancer.  Skin Cancer  Check your skin from head to toe regularly.  Tell your health care provider about any new moles or changes in moles, especially if: ? There is a change in a mole's size, shape, or color. ? You have a mole that is larger than a pencil eraser.  Always use sunscreen. Apply sunscreen liberally and repeat throughout the day.  Protect yourself by wearing long sleeves, pants, a wide-brimmed hat, and sunglasses when outside.  What should I know about heart disease, diabetes, and high blood pressure?  If you are 18-39 years of age, have your blood pressure checked every 3-5 years. If you are 40 years of age or older, have your blood pressure checked every year. You should have your blood pressure measured twice-once when you are at a hospital or clinic, and once when   you are not at a hospital or clinic. Record the average of the two measurements. To check your blood pressure when you are not at a hospital or clinic, you can use: ? An automated blood pressure machine at a pharmacy. ? A home blood pressure monitor.  Talk to your health care provider about your target blood pressure.  If you are between 54-79 years old, ask your health care provider if you should take aspirin to prevent heart disease.  Have regular diabetes screenings by checking your fasting blood  sugar level. ? If you are at a normal weight and have a low risk for diabetes, have this test once every three years after the age of 62. ? If you are overweight and have a high risk for diabetes, consider being tested at a younger age or more often.  A one-time screening for abdominal aortic aneurysm (AAA) by ultrasound is recommended for men aged 25-75 years who are current or former smokers. What should I know about preventing infection? Hepatitis B If you have a higher risk for hepatitis B, you should be screened for this virus. Talk with your health care provider to find out if you are at risk for hepatitis B infection. Hepatitis C Blood testing is recommended for:  Everyone born from [birth year] through 1965.  Anyone with known risk factors for hepatitis C.  Sexually Transmitted Diseases (STDs)  You should be screened each year for STDs including gonorrhea and chlamydia if: ? You are sexually active and are younger than 37 years of age. ? You are older than 37 years of age and your health care provider tells you that you are at risk for this type of infection. ? Your sexual activity has changed since you were last screened and you are at an increased risk for chlamydia or gonorrhea. Ask your health care provider if you are at risk.  Talk with your health care provider about whether you are at high risk of being infected with HIV. Your health care provider may recommend a prescription medicine to help prevent HIV infection.  What else can I do?  Schedule regular health, dental, and eye exams.  Stay current with your vaccines (immunizations).  Do not use any tobacco products, such as cigarettes, chewing tobacco, and e-cigarettes. If you need help quitting, ask your health care provider.  Limit alcohol intake to no more than 2 drinks per day. One drink equals 12 ounces of beer, 5 ounces of wine, or 1 ounces of hard liquor.  Do not use street drugs.  Do not share needles.  Ask your  health care provider for help if you need support or information about quitting drugs.  Tell your health care provider if you often feel depressed.  Tell your health care provider if you have ever been abused or do not feel safe at home. This information is not intended to replace advice given to you by your health care provider. Make sure you discuss any questions you have with your health care provider. Document Released: 03/21/2008 Document Revised: 05/22/2016 Document Reviewed: 06/27/2015 Elsevier Interactive Patient Education  2018 [employer].  Major Depressive Disorder, Adult Major depressive disorder (MDD) is a mental health condition. It may also be called clinical depression or unipolar depression. MDD usually causes feelings of sadness, hopelessness, or helplessness. MDD can also cause physical symptoms. It can interfere with work, school, relationships, and other everyday activities. MDD may be mild, moderate, or severe. It may occur once (single episode major depressive  disorder) or it may occur multiple times (recurrent major depressive disorder). What are the causes? The exact cause of this condition is not known. MDD is most likely caused by a combination of things, which may include:  Genetic factors. These are traits that are passed along from parent to child.  Individual factors. Your personality, your behavior, and the way you handle your thoughts and feelings may contribute to MDD. This includes personality traits and behaviors learned from others.  Physical factors, such as: ? Differences in the part of your brain that controls emotion. This part of your brain may be different than it is in people who do not have MDD. ? Long-term (chronic) medical or psychiatric illnesses.  Social factors. Traumatic experiences or major life changes may play a role in the development of MDD.  What increases the risk? This condition is more likely to develop in women. The following  factors may also make you more likely to develop MDD:  A family history of depression.  Troubled family relationships.  Abnormally low levels of certain brain chemicals.  Traumatic events in childhood, especially abuse or the loss of a parent.  Being under a lot of stress, or long-term stress, especially from upsetting life experiences or losses.  A history of: ? Chronic physical illness. ? Other mental health disorders. ? Substance abuse.  Poor living conditions.  Experiencing social exclusion or discrimination on a regular basis.  What are the signs or symptoms? The main symptoms of MDD typically include:  Constant depressed or irritable mood.  Loss of interest in things and activities.  MDD symptoms may also include:  Sleeping or eating too much or too little.  Unexplained weight change.  Fatigue or low energy.  Feelings of worthlessness or guilt.  Difficulty thinking clearly or making decisions.  Thoughts of suicide or of harming others.  Physical agitation or weakness.  Isolation.  Severe cases of MDD may also occur with other symptoms, such as:  Delusions or hallucinations, in which you imagine things that are not real (psychotic depression).  Low-level depression that lasts at least a year (chronic depression or persistent depressive disorder).  Extreme sadness and hopelessness (melancholic depression).  Trouble speaking and moving (catatonic depression).  How is this diagnosed? This condition may be diagnosed based on:  Your symptoms.  Your medical history, including your mental health history. This may involve tests to evaluate your mental health. You may be asked questions about your lifestyle, including any drug and alcohol use, and how long you have had symptoms of MDD.  A physical exam.  Blood tests to rule out other conditions.  You must have a depressed mood and at least four other MDD symptoms most of the day, nearly every day in the  same 2-week timeframe before your health care provider can confirm a diagnosis of MDD. How is this treated? This condition is usually treated by mental health professionals, such as psychologists, psychiatrists, and clinical social workers. You may need more than one type of treatment. Treatment may include:  Psychotherapy. This is also called talk therapy or counseling. Types of psychotherapy include: ? Cognitive behavioral therapy (CBT). This type of therapy teaches you to recognize unhealthy feelings, thoughts, and behaviors, and replace them with positive thoughts and actions. ? Interpersonal therapy (IPT). This helps you to improve the way you relate to and communicate with others. ? Family therapy. This treatment includes members of your family.  Medicine to treat anxiety and depression, or to help you control  certain emotions and behaviors.  Lifestyle changes, such as: ? Limiting alcohol and drug use. ? Exercising regularly. ? Getting plenty of sleep. ? Making healthy eating choices. ? Spending more time outdoors.  Treatments involving stimulation of the brain can be used in situations with extremely severe symptoms, or when medicine or other therapies do not work over time. These treatments include electroconvulsive therapy, transcranial magnetic stimulation, and vagal nerve stimulation. Follow these instructions at home: Activity  Return to your normal activities as told by your health care provider.  Exercise regularly and spend time outdoors as told by your health care provider. General instructions  Take over-the-counter and prescription medicines only as told by your health care provider.  Do not drink alcohol. If you drink alcohol, limit your alcohol intake to no more than 1 drink a day for nonpregnant women and 2 drinks a day for men. One drink equals 12 oz of beer, 5 oz of wine, or 1 oz of hard liquor. Alcohol can affect any antidepressant medicines you are taking. Talk  to your health care provider about your alcohol use.  Eat a healthy diet and get plenty of sleep.  Find activities that you enjoy doing, and make time to do them.  Consider joining a support group. Your health care provider may be able to recommend a support group.  Keep all follow-up visits as told by your health care provider. This is important. Where to find more information: [employer] on Mental Illness  www.nami.org  [place]. National Institute of Mental Health  https://carter.com/  National Suicide Prevention Lifeline  1-800-273-TALK (915)753-5299). This is free, 24-hour help.  Contact a health care provider if:  Your symptoms get worse.  You develop new symptoms. Get help right away if:  You self-harm.  You have serious thoughts about hurting yourself or others.  You see, hear, taste, smell, or feel things that are not present (hallucinate). This information is not intended to replace advice given to you by your health care provider. Make sure you discuss any questions you have with your health care provider. Document Released: 01/18/2013 Document Revised: 05/30/2016 Document Reviewed: 04/03/2016 Elsevier Interactive Patient Education  [employer].

## 2018-07-29 ENCOUNTER — Ambulatory Visit: Payer: Self-pay | Admitting: Family Medicine

## 2018-08-18 ENCOUNTER — Other Ambulatory Visit: Payer: Self-pay | Admitting: Family Medicine

## 2018-08-18 DIAGNOSIS — F339 Major depressive disorder, recurrent, unspecified: Secondary | ICD-10-CM

## 2018-08-27 ENCOUNTER — Ambulatory Visit: Payer: Self-pay | Admitting: Family Medicine

## 2018-08-27 ENCOUNTER — Encounter: Payer: Self-pay | Admitting: Family Medicine

## 2018-08-27 VITALS — Ht 73.5 in

## 2018-08-27 DIAGNOSIS — Z0289 Encounter for other administrative examinations: Secondary | ICD-10-CM

## 2018-08-27 NOTE — Progress Notes (Deleted)
Established Patient Office Visit  Subjective:  Patient ID: Rodney Neuser., male    DOB: 06/02/1981  Age: 37 y.o. MRN: [id number]  CC:  Chief Complaint  Patient presents with  . Follow-up    HPI Rodney Huster. presents for a follow up on his depression and his previous blood work. Rodney Cherry was started on Venlafaxine 75 mg twice daily at his last office visit. Patient has been tolerating the medication well with no side effects. Rodney Cherry blood work showed a high LDL level of 148, his AST and ALT were elevated, his TSH was elevated at 4.55, and his total cholesterol and triglycerides were elevated.   Past Medical History:  Diagnosis Date  . Allergy   . Asthma   . Depression   . GERD (gastroesophageal reflux disease)   . Migraine   . Seizures ([place])     Past Surgical History:  Procedure Laterality Date  . APPENDECTOMY    . CHOLECYSTECTOMY    . NASAL SEPTUM SURGERY    . VASECTOMY    . WISDOM TOOTH EXTRACTION      Family History  Problem Relation Age of Onset  . Arthritis Mother   . Diabetes Mother   . Hearing loss Mother   . Hyperlipidemia Mother   . Hypertension Mother   . Kidney disease Mother   . Alcohol abuse Father   . Arthritis Father   . Asthma Father   . Cancer Father   . Depression Father   . Drug abuse Father   . Hearing loss Father   . Heart attack Father   . Heart disease Father   . Hyperlipidemia Father   . Hypertension Father   . Kidney disease Father   . Stroke Father   . Stroke Sister   . Depression Sister   . Diabetes Sister   . Hearing loss Sister   . Kidney disease Sister   . Learning disabilities Sister   . Mental illness Sister   . Asthma Daughter   . Arthritis Maternal Grandmother   . Cancer Maternal Grandmother   . Diabetes Maternal Grandmother   . Hypertension Maternal Grandmother   . Kidney disease Maternal Grandmother   . Stroke Maternal Grandmother   . Arthritis Maternal Grandfather   . Cancer Maternal Grandfather   .  Heart attack Maternal Grandfather   . Arthritis Paternal Grandmother   . Alcohol abuse Paternal Grandmother   . Cancer Paternal Grandmother   . COPD Paternal Grandmother   . Diabetes Paternal Grandmother   . Heart attack Paternal Grandmother   . Hearing loss Paternal Grandmother   . Hypertension Paternal Grandmother   . Arthritis Paternal Grandfather   . Alcohol abuse Paternal Grandfather   . Cancer Paternal Grandfather   . COPD Paternal Grandfather   . Diabetes Paternal Grandfather   . Heart attack Paternal Grandfather   . Heart disease Paternal Grandfather   . Hearing loss Paternal Grandfather   . Hypertension Paternal Grandfather   . Stroke Paternal Grandfather   . Birth defects Sister     Social History   Socioeconomic History  . Marital status: Married    Spouse name: Not on file  . Number of children: 1  . Years of education: Not on file  . Highest education level: Some college, no degree  Occupational History  . Not on file  Social Needs  . Financial resource strain: Not hard at all  . Food insecurity:    Worry:  Never true    Inability: Never true  . Transportation needs:    Medical: No    Non-medical: No  Tobacco Use  . Smoking status: Former Research scientist (life sciences)  . Smokeless tobacco: Never Used  Substance and Sexual Activity  . Alcohol use: Yes    Comment: rare  . Drug use: Never  . Sexual activity: Yes    Birth control/protection: None  Lifestyle  . Physical activity:    Days per week: 0 days    Minutes per session: 0 min  . Stress: Very much  Relationships  . Social connections:    Talks on phone: More than three times a week    Gets together: Once a week    Attends religious service: Never    Active member of club or organization: No    Attends meetings of clubs or organizations: Never    Relationship status: Married  . Intimate partner violence:    Fear of current or ex partner: No    Emotionally abused: No    Physically abused: No    Forced sexual  activity: No  Other Topics Concern  . Not on file  Social History Narrative  . Not on file    Outpatient Medications Prior to Visit  Medication Sig Dispense Refill  . albuterol (PROVENTIL HFA;VENTOLIN HFA) 108 (90 Base) MCG/ACT inhaler Inhale 2 puffs into the lungs every 6 (six) hours as needed for wheezing or shortness of breath. 1 Inhaler 1  . diclofenac sodium (VOLTAREN) 1 % GEL Apply 2 g topically 4 (four) times daily. 100 g 1  . eletriptan (RELPAX) 40 MG tablet TAKE ONE TABLET (40 MG DOSE) BY MOUTH EVERY 2 (TWO) HOURS AS NEEDED (MAX 2/24HOURS).  5  . esomeprazole (NEXIUM) 40 MG capsule Take 40 mg by mouth daily at 12 noon.    Marland Kitchen Galcanezumab-gnlm (EMGALITY) 120 MG/ML SOAJ Inject into the skin.    . promethazine (PHENERGAN) 25 MG tablet Take 25 mg by mouth every 6 (six) hours as needed for nausea or vomiting.    . venlafaxine (EFFEXOR) 75 MG tablet Take 1 tablet (75 mg total) by mouth 2 (two) times daily with a meal. 60 tablet 1   No facility-administered medications prior to visit.     Allergies  Allergen Reactions  . 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 Escitalopram Oxalate Rash  . Hydrocodone Other (See Comments)    Rebound headaches  . Latex Rash  . Morphine Other (See Comments)    Severe headaches Severe migraine   . Mushroom Extract Complex Other (See Comments)  . Other Other (See Comments)    All codeine medication increase migraine symptoms  . Sulfa Antibiotics Other (See Comments)    Causes glaucoma  VISION LOSS     ROS Review of Systems    Objective:    Physical Exam  Ht 6' 1.5" (1.867 m)   BMI 38.65 kg/m  Wt Readings from Last 3 Encounters:  07/27/18 297 lb (134.7 kg)  01/29/18 293 lb 6.4 oz (133.1 kg)  01/09/18 290 lb 6 oz (131.7 kg)     Health Maintenance Due  Topic Date Due  . HIV Screening  01/09/1996    There are no preventive care reminders to display for this  patient.  Lab Results  Component Value Date   TSH 4.55 (H) 07/27/2018   Lab Results  Component Value Date   WBC 9.7 07/27/2018  HGB 15.8 07/27/2018   HCT 47.2 07/27/2018   MCV 83.6 07/27/2018   PLT 284.0 07/27/2018   Lab Results  Component Value Date   NA 138 07/27/2018   K 4.2 07/27/2018   CO2 27 07/27/2018   GLUCOSE 111 (H) 07/27/2018   BUN 11 07/27/2018   CREATININE 0.88 07/27/2018   BILITOT 0.4 07/27/2018   ALKPHOS 77 07/27/2018   AST 43 (H) 07/27/2018   ALT 69 (H) 07/27/2018   PROT 7.4 07/27/2018   ALBUMIN 4.6 07/27/2018   CALCIUM 10.0 07/27/2018   GFR 103.26 07/27/2018   Lab Results  Component Value Date   CHOL 207 (H) 07/27/2018   Lab Results  Component Value Date   HDL 43.20 07/27/2018   No results found for: [facility] Lab Results  Component Value Date   TRIG 325.0 (H) 07/27/2018   Lab Results  Component Value Date   CHOLHDL 5 07/27/2018   No results found for: HGBA1C    Assessment & Plan:   Problem List Items Addressed This Visit    None      No orders of the defined types were placed in this encounter.   Follow-up: No follow-ups on file.

## 2018-09-10 DIAGNOSIS — J3489 Other specified disorders of nose and nasal sinuses: Secondary | ICD-10-CM | POA: Diagnosis not present

## 2018-09-10 DIAGNOSIS — H8192 Unspecified disorder of vestibular function, left ear: Secondary | ICD-10-CM | POA: Diagnosis not present

## 2018-09-24 DIAGNOSIS — R42 Dizziness and giddiness: Secondary | ICD-10-CM | POA: Diagnosis not present

## 2018-09-24 DIAGNOSIS — M6281 Muscle weakness (generalized): Secondary | ICD-10-CM | POA: Diagnosis not present

## 2018-10-02 DIAGNOSIS — M6281 Muscle weakness (generalized): Secondary | ICD-10-CM | POA: Diagnosis not present

## 2018-10-02 DIAGNOSIS — R42 Dizziness and giddiness: Secondary | ICD-10-CM | POA: Diagnosis not present

## 2018-10-26 DIAGNOSIS — M542 Cervicalgia: Secondary | ICD-10-CM | POA: Diagnosis not present

## 2018-10-26 DIAGNOSIS — R2689 Other abnormalities of gait and mobility: Secondary | ICD-10-CM | POA: Diagnosis not present

## 2018-10-26 DIAGNOSIS — G43709 Chronic migraine without aura, not intractable, without status migrainosus: Secondary | ICD-10-CM | POA: Diagnosis not present

## 2018-10-26 DIAGNOSIS — H8192 Unspecified disorder of vestibular function, left ear: Secondary | ICD-10-CM | POA: Diagnosis not present

## 2018-10-29 ENCOUNTER — Telehealth: Payer: Self-pay

## 2018-10-29 DIAGNOSIS — H8192 Unspecified disorder of vestibular function, left ear: Secondary | ICD-10-CM | POA: Diagnosis not present

## 2018-10-29 DIAGNOSIS — G43709 Chronic migraine without aura, not intractable, without status migrainosus: Secondary | ICD-10-CM | POA: Diagnosis not present

## 2018-10-29 DIAGNOSIS — M542 Cervicalgia: Secondary | ICD-10-CM | POA: Diagnosis not present

## 2018-10-29 DIAGNOSIS — R2689 Other abnormalities of gait and mobility: Secondary | ICD-10-CM | POA: Diagnosis not present

## 2018-10-29 NOTE — Telephone Encounter (Signed)
Copied from [place] [id number]. Topic: Appointment Scheduling - Transfer of Care >> Oct 29, 2018 11:59 AM Rutherford Nail, NT wrote: Pt is requesting to transfer FROM: Dr Ethelene Hal Pt is requesting to transfer TO: Dr Zigmund Daniel Reason for requested transfer: did not give that information  Send CRM to patient's current PCP (transferring FROM).

## 2018-11-05 DIAGNOSIS — G43709 Chronic migraine without aura, not intractable, without status migrainosus: Secondary | ICD-10-CM | POA: Diagnosis not present

## 2018-11-05 DIAGNOSIS — H8192 Unspecified disorder of vestibular function, left ear: Secondary | ICD-10-CM | POA: Diagnosis not present

## 2018-11-05 DIAGNOSIS — R2689 Other abnormalities of gait and mobility: Secondary | ICD-10-CM | POA: Diagnosis not present

## 2018-11-05 DIAGNOSIS — M542 Cervicalgia: Secondary | ICD-10-CM | POA: Diagnosis not present

## 2018-11-12 DIAGNOSIS — H8192 Unspecified disorder of vestibular function, left ear: Secondary | ICD-10-CM | POA: Diagnosis not present

## 2018-11-12 DIAGNOSIS — G43909 Migraine, unspecified, not intractable, without status migrainosus: Secondary | ICD-10-CM | POA: Diagnosis not present

## 2018-11-23 IMAGING — CT CT NECK W/ CM
3 of 5 series · 13 of 33 positions shown, 16 images · IV contrast (iopamidol)
Comparison: 02/20/2017 soft tissue ultrasound.

CLINICAL DATA: 36 y/o M; left supraclavicular mass groin over 2
months.

EXAM:
CT NECK WITH CONTRAST
TECHNIQUE: Multidetector CT imaging of the neck was performed using the
standard protocol following the bolus administration of intravenous
contrast.
CONTRAST:  75mL OXXYKA-CRR IOPAMIDOL (OXXYKA-CRR) INJECTION 61%

[Series 7: sag neck · sagittal · 0.55mm/px · 5 of 162 slices shown, 6 images]
[im 54/162  bone]
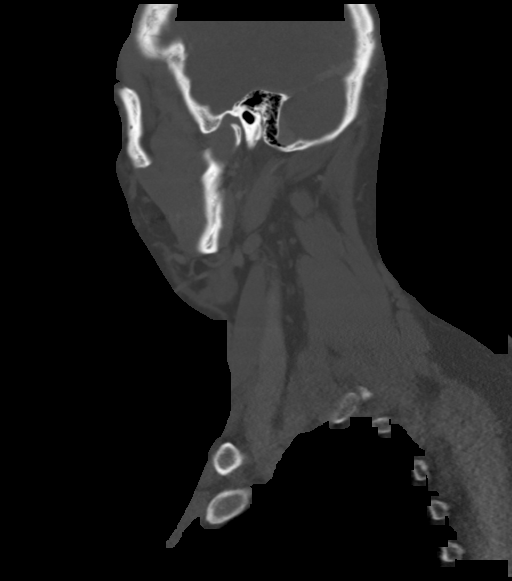
[im 68/162  bone]
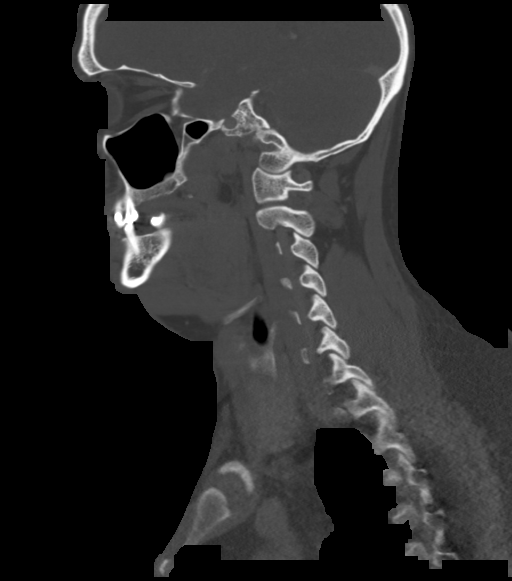
[im 81/162  soft-tissue]
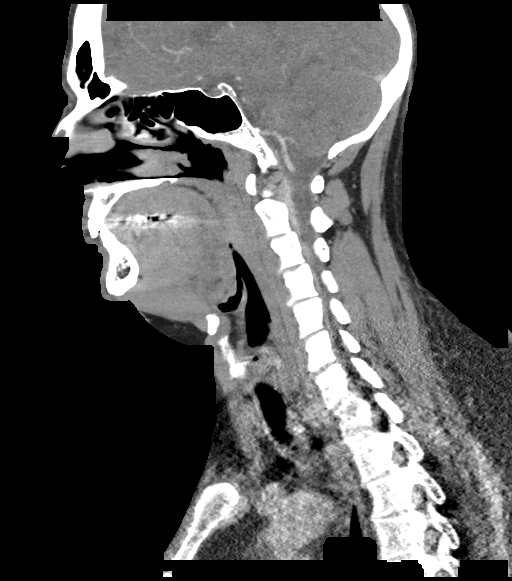
[im 81/162  bone]
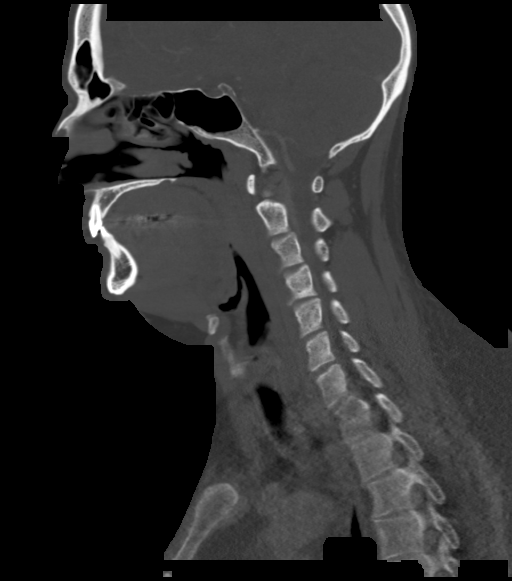
[im 94/162  bone]
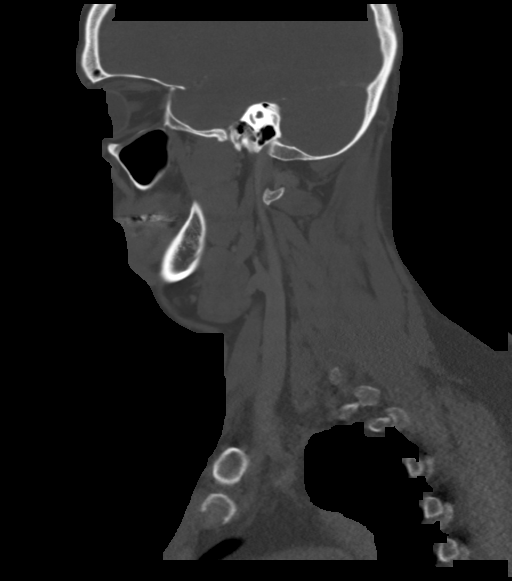
[im 108/162  bone]
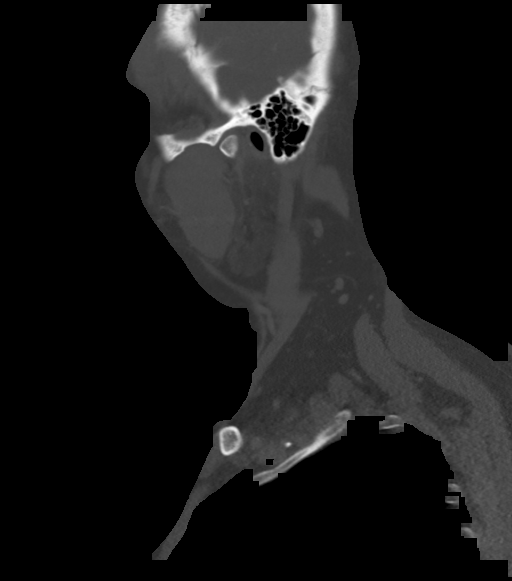

[Series 8: cor neck · coronal · 0.69mm/px · 3 of 131 slices shown]
[im 27/131  bone]
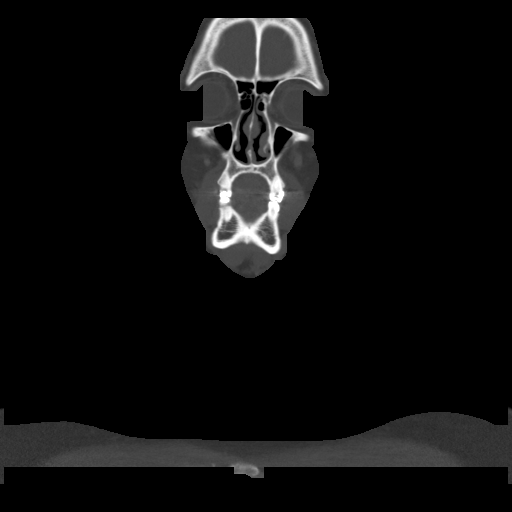
[im 53/131  bone]
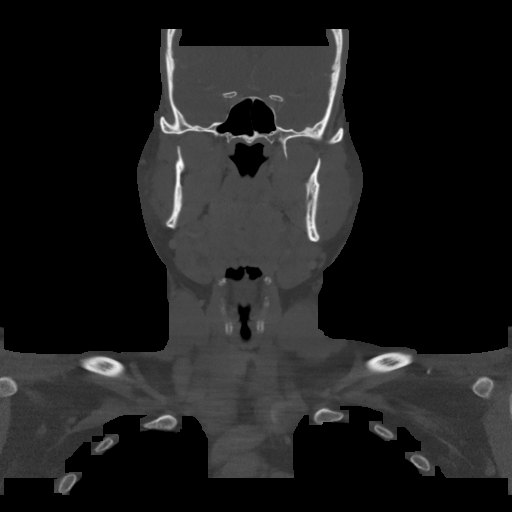
[im 79/131  bone]
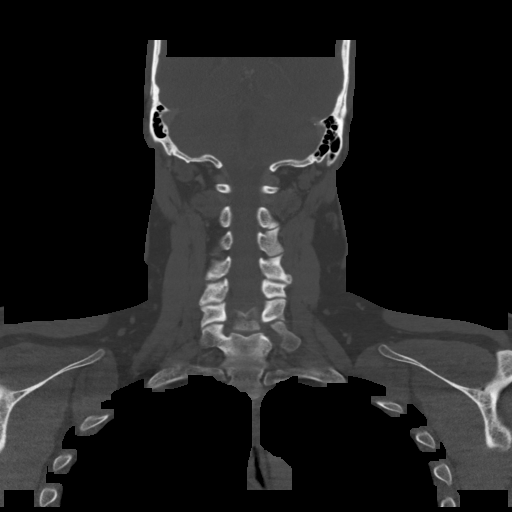

[Series 9: orthogonal ax · axial · 0.53mm/px · z∈[-478,-245]mm · 5 of 180 slices shown, 7 images]
[im 30/180  soft-tissue]
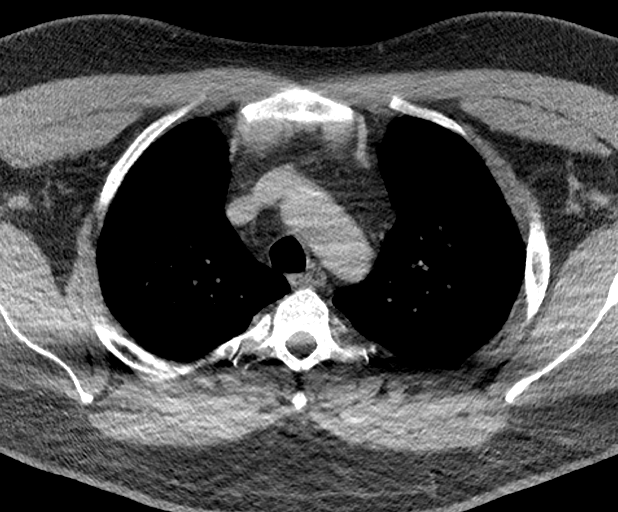
[im 30/180  bone]
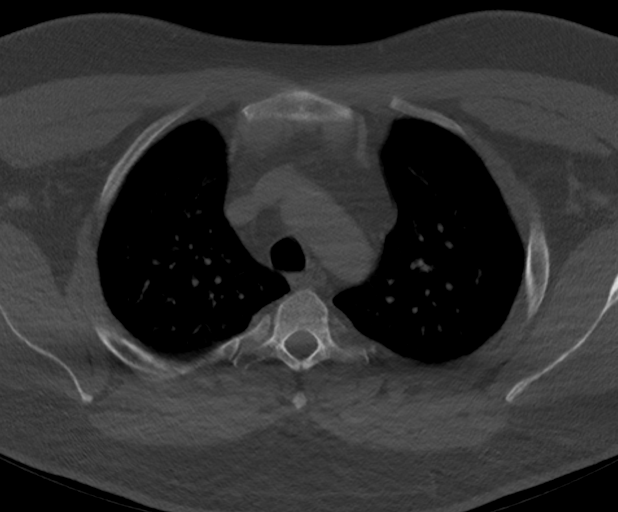
[im 60/180  bone]
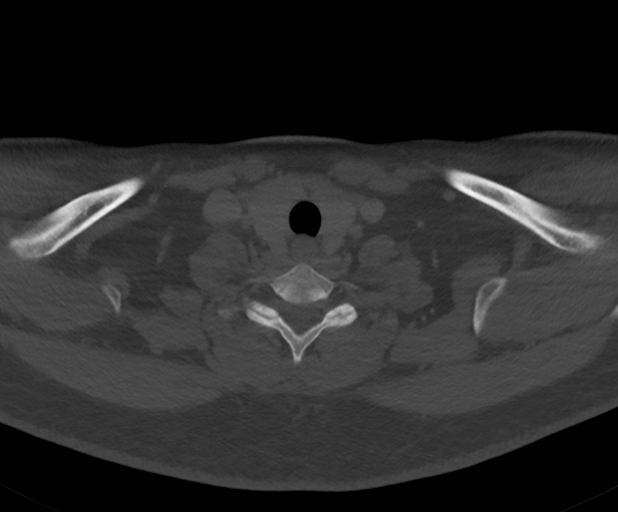
[im 90/180  bone]
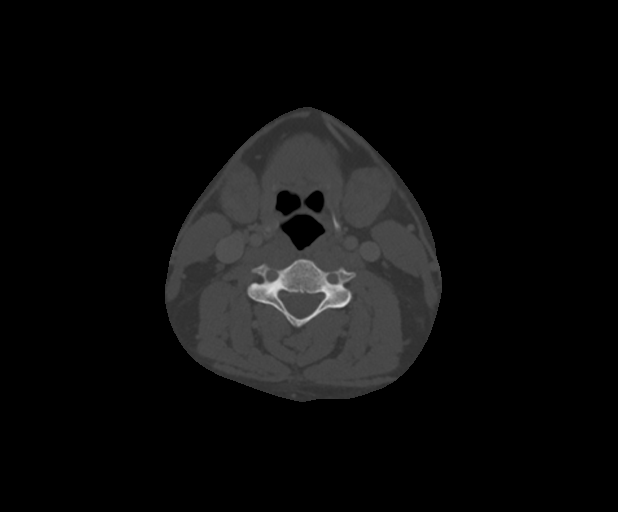
[im 120/180  bone]
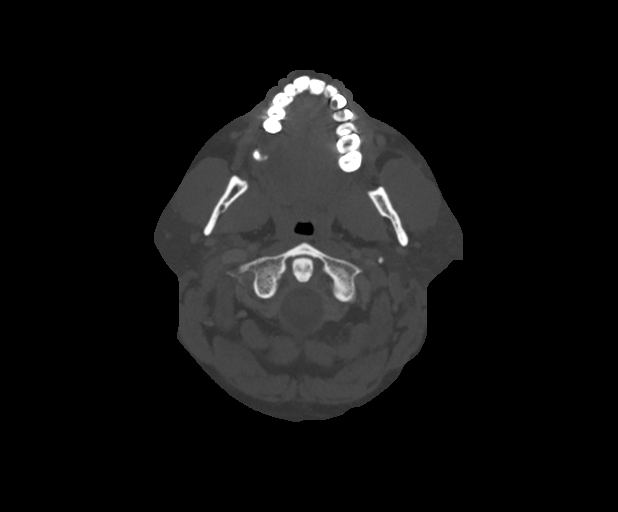
[im 150/180  soft-tissue]
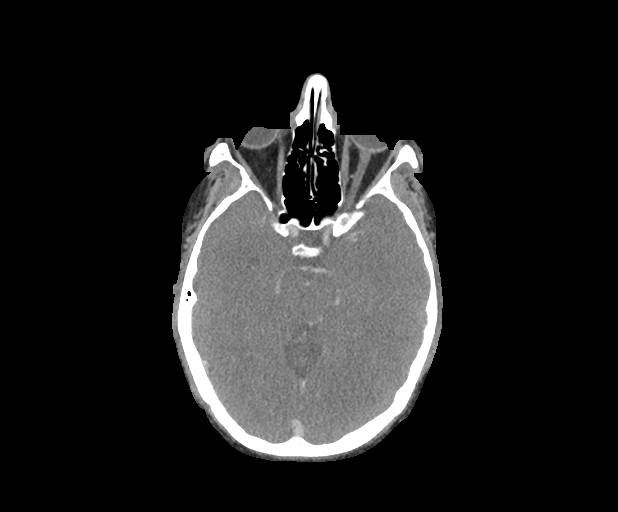
[im 150/180  bone]
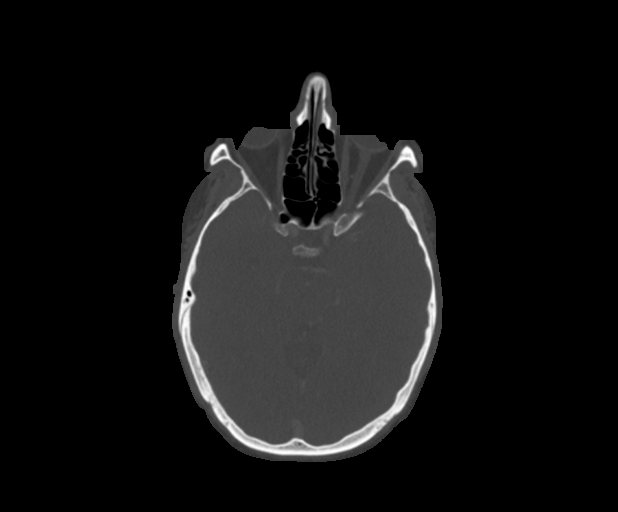

[13 of 33 positions shown; findings below may reference images not displayed]

FINDINGS: Pharynx and larynx: Normal. No mass or swelling.

Salivary glands: No inflammation, mass, or stone.

Thyroid: Normal.

Lymph nodes: None enlarged or abnormal density.

Vascular: Negative.

Limited intracranial: Negative.

Visualized orbits: Negative.

Mastoids and visualized paranasal sinuses: Clear.

Skeleton: No acute or aggressive process.

Upper chest: Negative.

Other: Asymmetry in supraclavicular fat, with the left more
voluminous than the right. No soft tissue mass or inflammatory
changes. No encapsulated lipoma identified. The finding is subtle
and best appreciated on coronal image 72.
IMPRESSION: Asymmetric increased volume of left supraclavicular fat may
represent an unencapsulated lipoma. No mass or inflammatory process
identified. Otherwise unremarkable CT of the neck with contrast.

By: Danii Aujla M.D.

## 2018-11-27 DIAGNOSIS — H8192 Unspecified disorder of vestibular function, left ear: Secondary | ICD-10-CM | POA: Diagnosis not present

## 2018-11-27 DIAGNOSIS — G43909 Migraine, unspecified, not intractable, without status migrainosus: Secondary | ICD-10-CM | POA: Diagnosis not present

## 2018-11-27 NOTE — Telephone Encounter (Addendum)
Caller name: Montante,Samantha Relation to pt: spouse  Call back number: [phone]   Reason for call:  Spouse checking on the status of TOC to Dr. Rodena Piety, please advise

## 2018-11-30 NOTE — Telephone Encounter (Signed)
Okay to schedule

## 2018-11-30 NOTE — Telephone Encounter (Signed)
Okay to transfer  

## 2018-11-30 NOTE — Telephone Encounter (Signed)
Ok to transfer. 

## 2018-11-30 NOTE — Telephone Encounter (Signed)
I called and spoke to patient. Patient informed that TOC had been approved to see Dr. Zigmund Daniel. Patient stated he will call back to schedule an appointment with Dr. Luetta Nutting.

## 2018-12-04 DIAGNOSIS — H8192 Unspecified disorder of vestibular function, left ear: Secondary | ICD-10-CM | POA: Diagnosis not present

## 2018-12-04 DIAGNOSIS — G43909 Migraine, unspecified, not intractable, without status migrainosus: Secondary | ICD-10-CM | POA: Diagnosis not present

## 2018-12-11 ENCOUNTER — Encounter: Payer: Self-pay | Admitting: Family Medicine

## 2018-12-11 ENCOUNTER — Ambulatory Visit (INDEPENDENT_AMBULATORY_CARE_PROVIDER_SITE_OTHER): Payer: Managed Care, Other (non HMO) | Admitting: Family Medicine

## 2018-12-11 ENCOUNTER — Other Ambulatory Visit: Payer: Self-pay

## 2018-12-11 DIAGNOSIS — F339 Major depressive disorder, recurrent, unspecified: Secondary | ICD-10-CM | POA: Diagnosis not present

## 2018-12-11 DIAGNOSIS — G43709 Chronic migraine without aura, not intractable, without status migrainosus: Secondary | ICD-10-CM

## 2018-12-11 DIAGNOSIS — J4530 Mild persistent asthma, uncomplicated: Secondary | ICD-10-CM | POA: Diagnosis not present

## 2018-12-11 MED ORDER — MONTELUKAST SODIUM 10 MG PO TABS: 10.0000 mg | ORAL_TABLET | Freq: Every day | ORAL | 3 refills | Status: DC

## 2018-12-11 NOTE — Progress Notes (Signed)
Rodney Cherry. - 38 y.o. male MRN [id number]  Date of birth: 12/17/1980  Subjective Chief Complaint  Patient presents with  . Transitions Of Care  . Asthma    Pt has been needing his rescue inhaler more    HPI Rodney Cherry. is a 38 y.o. male here today to establish with new PCP.  He has a history of asthma, depression, gerd and migraines.  He is followed by neurology for management of migraines and psychiatry for management of depression.  He was treated with lamictal and effexor.  Lamictal d/c'ed due to rash and effexor stopped as he didn't find it to be very effective.  Main issue today is that he feels that his asthma has not been well controlled.  He is needing his rescue inhaler at least once per day.  He does have associated wheezing with this as well.  He reports reflux has been fairly well controlled with nexium.    ROS:  A comprehensive ROS was completed and negative except as noted per HPI  Allergies  Allergen Reactions  . 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 Escitalopram Oxalate Rash  . Hydrocodone Other (See Comments)    Rebound headaches  . Latex Rash  . Morphine Other (See Comments)    Severe headaches Severe migraine   . Mushroom Extract Complex Other (See Comments)  . Other Other (See Comments)    All codeine medication increase migraine symptoms  . Sulfa Antibiotics Other (See Comments)    Causes glaucoma  VISION LOSS     Past Medical History:  Diagnosis Date  . Allergy   . Asthma   . Depression   . GERD (gastroesophageal reflux disease)   . Migraine   . Seizures ([place])     Past Surgical History:  Procedure Laterality Date  . APPENDECTOMY    . CHOLECYSTECTOMY    . NASAL SEPTUM SURGERY    . VASECTOMY    . WISDOM TOOTH EXTRACTION      Social History   Socioeconomic History  . Marital status: Married    Spouse name: Not on file  . Number of children: 1  . Years of education:  Not on file  . Highest education level: Some college, no degree  Occupational History  . Not on file  Social Needs  . Financial resource strain: Not hard at all  . Food insecurity:    Worry: Never true    Inability: Never true  . Transportation needs:    Medical: No    Non-medical: No  Tobacco Use  . Smoking status: Former Research scientist (life sciences)  . Smokeless tobacco: Never Used  Substance and Sexual Activity  . Alcohol use: Yes    Comment: rare  . Drug use: Never  . Sexual activity: Yes    Birth control/protection: None  Lifestyle  . Physical activity:    Days per week: 0 days    Minutes per session: 0 min  . Stress: Very much  Relationships  . Social connections:    Talks on phone: More than three times a week    Gets together: Once a week    Attends religious service: Never    Active member of club or organization: No    Attends meetings of clubs or organizations: Never    Relationship status: Married  Other Topics Concern  . Not on file  Social History Narrative  . Not on file  Family History  Problem Relation Age of Onset  . Arthritis Mother   . Diabetes Mother   . Hearing loss Mother   . Hyperlipidemia Mother   . Hypertension Mother   . Kidney disease Mother   . Alcohol abuse Father   . Arthritis Father   . Asthma Father   . Cancer Father   . Depression Father   . Drug abuse Father   . Hearing loss Father   . Heart attack Father   . Heart disease Father   . Hyperlipidemia Father   . Hypertension Father   . Kidney disease Father   . Stroke Father   . Stroke Sister   . Depression Sister   . Diabetes Sister   . Hearing loss Sister   . Kidney disease Sister   . Learning disabilities Sister   . Mental illness Sister   . Asthma Daughter   . Arthritis Maternal Grandmother   . Cancer Maternal Grandmother   . Diabetes Maternal Grandmother   . Hypertension Maternal Grandmother   . Kidney disease Maternal Grandmother   . Stroke Maternal Grandmother   . Arthritis  Maternal Grandfather   . Cancer Maternal Grandfather   . Heart attack Maternal Grandfather   . Arthritis Paternal Grandmother   . Alcohol abuse Paternal Grandmother   . Cancer Paternal Grandmother   . COPD Paternal Grandmother   . Diabetes Paternal Grandmother   . Heart attack Paternal Grandmother   . Hearing loss Paternal Grandmother   . Hypertension Paternal Grandmother   . Arthritis Paternal Grandfather   . Alcohol abuse Paternal Grandfather   . Cancer Paternal Grandfather   . COPD Paternal Grandfather   . Diabetes Paternal Grandfather   . Heart attack Paternal Grandfather   . Heart disease Paternal Grandfather   . Hearing loss Paternal Grandfather   . Hypertension Paternal Grandfather   . Stroke Paternal Grandfather   . Birth defects Sister     Health Maintenance  Topic Date Due  . HIV Screening  01/09/1996  . TETANUS/TDAP  01/30/2028  . INFLUENZA VACCINE  Completed    ----------------------------------------------------------------------------------------------------------------------------------------------------------------------------------------------------------------- Physical Exam BP 124/72 (BP Location: Right Arm, Patient Position: Sitting, Cuff Size: Large)   Pulse 92   Temp (!) 97.4 F (36.3 C) (Oral)   Ht 6' 1.5" (1.867 m)   Wt 299 lb 3.2 oz (135.7 kg)   SpO2 97%   BMI 38.94 kg/m   Physical Exam Constitutional:      Appearance: Normal appearance.  HENT:     Head: Normocephalic and atraumatic.     Mouth/Throat:     Mouth: Mucous membranes are moist.  Eyes:     General: No scleral icterus. Neck:     Musculoskeletal: Neck supple.  Cardiovascular:     Rate and Rhythm: Normal rate and regular rhythm.  Pulmonary:     Effort: Pulmonary effort is normal.     Breath sounds: Normal breath sounds.  Skin:    General: Skin is warm and dry.  Neurological:     General: No focal deficit present.     Mental Status: He is alert.  Psychiatric:        Mood  and Affect: Mood normal.        Behavior: Behavior normal.     ------------------------------------------------------------------------------------------------------------------------------------------------------------------------------------------------------------------- Assessment and Plan  Chronic migraine without aura -Still with frequent migraines, decreased some with Emgality and relpax.  He is followed by neurology.   Mild persistent asthma -Will add on singulair and continue  albuterol as needed.   Depression, recurrent ([place]) -Stable, continue to follow with psychiatry.

## 2018-12-11 NOTE — Patient Instructions (Signed)
-  Start singulair daily -Continue albuterol as needed  -Follow up in 4-6 weeks.

## 2018-12-11 NOTE — Assessment & Plan Note (Signed)
-  Will add on singulair and continue albuterol as needed.

## 2018-12-11 NOTE — Assessment & Plan Note (Signed)
-  Stable, continue to follow with psychiatry.

## 2018-12-11 NOTE — Assessment & Plan Note (Signed)
-  Still with frequent migraines, decreased some with Emgality and relpax.  He is followed by neurology.

## 2018-12-29 ENCOUNTER — Other Ambulatory Visit: Payer: Self-pay | Admitting: Family Medicine

## 2018-12-29 DIAGNOSIS — F339 Major depressive disorder, recurrent, unspecified: Secondary | ICD-10-CM

## 2018-12-29 NOTE — Telephone Encounter (Signed)
LOV 12/11/18, Pt D/C Rx Effexor 37.5 mg before this visit. Please advise.

## 2019-01-07 DIAGNOSIS — G43709 Chronic migraine without aura, not intractable, without status migrainosus: Secondary | ICD-10-CM | POA: Diagnosis not present

## 2019-01-12 ENCOUNTER — Other Ambulatory Visit: Payer: Self-pay | Admitting: Family Medicine

## 2019-01-12 DIAGNOSIS — F339 Major depressive disorder, recurrent, unspecified: Secondary | ICD-10-CM

## 2019-01-14 ENCOUNTER — Encounter: Payer: Self-pay | Admitting: Family Medicine

## 2019-01-14 ENCOUNTER — Ambulatory Visit (INDEPENDENT_AMBULATORY_CARE_PROVIDER_SITE_OTHER): Payer: Managed Care, Other (non HMO) | Admitting: Family Medicine

## 2019-01-14 DIAGNOSIS — J4530 Mild persistent asthma, uncomplicated: Secondary | ICD-10-CM | POA: Diagnosis not present

## 2019-01-14 MED ORDER — FLUTICASONE FUROATE-VILANTEROL 100-25 MCG/INH IN AEPB: 1.0000 | INHALATION_SPRAY | Freq: Every day | RESPIRATORY_TRACT | 6 refills | Status: DC

## 2019-01-14 NOTE — Assessment & Plan Note (Signed)
-  Not well controlled -D/c singulair due to side effects -Start breo daily -He will contact me via mychart to let me know how he is feeling in a few weeks.

## 2019-01-14 NOTE — Progress Notes (Signed)
Rodney Cherry. - 38 y.o. male MRN [id number]  Date of birth: [birth year]   This visit type was conducted due to national recommendations for restrictions regarding the COVID-19 Pandemic (e.g. social distancing).  This format is felt to be most appropriate for this patient at this time.  All issues noted in this document were discussed and addressed.  No physical exam was performed (except for noted visual exam findings with Video Visits).  I discussed the limitations of evaluation and management by telemedicine and the availability of in person appointments. The patient expressed understanding and agreed to proceed.  I connected with@ on 01/14/19 at  1:15 PM EDT by a video enabled telemedicine application and verified that I am speaking with the correct person using two identifiers.   Patient Location: Home [address] [place] [address]   Provider location:   [facility]  Chief Complaint  Patient presents with  . Follow-up    Webex , Asthma, Flaring up , increased inhaler useage    HPI  Rodney Cherry. is a 38 y.o. male who presents via audio/video conferencing for a telehealth visit today.  He is following up today on his asthma.  He seen about 1 month ago and reported increased shortness of breath and wheezing.  He has been requiring to use his rescue inhaler more often as well.  He was started on singulair at previous appt however discontinued due to having nosebleeds after starting this.  He continues to need his rescue inhaler nearly every other day.   He reports reflux symptoms are well controlled.  Symptoms have not worsened due to pollen/allergies.    ROS:  A comprehensive ROS was completed and negative except as noted per HPI  Past Medical History:  Diagnosis Date  . Allergy   . Asthma   . Depression   . GERD (gastroesophageal reflux disease)   . Migraine   . Mild intermittent asthma 01/29/2018  . Seizures ([place])     Past Surgical History:  Procedure  Laterality Date  . APPENDECTOMY    . CHOLECYSTECTOMY    . NASAL SEPTUM SURGERY    . VASECTOMY    . WISDOM TOOTH EXTRACTION      Family History  Problem Relation Age of Onset  . Arthritis Mother   . Diabetes Mother   . Hearing loss Mother   . Hyperlipidemia Mother   . Hypertension Mother   . Kidney disease Mother   . Alcohol abuse Father   . Arthritis Father   . Asthma Father   . Cancer Father   . Depression Father   . Drug abuse Father   . Hearing loss Father   . Heart attack Father   . Heart disease Father   . Hyperlipidemia Father   . Hypertension Father   . Kidney disease Father   . Stroke Father   . Stroke Sister   . Depression Sister   . Diabetes Sister   . Hearing loss Sister   . Kidney disease Sister   . Learning disabilities Sister   . Mental illness Sister   . Asthma Daughter   . Arthritis Maternal Grandmother   . Cancer Maternal Grandmother   . Diabetes Maternal Grandmother   . Hypertension Maternal Grandmother   . Kidney disease Maternal Grandmother   . Stroke Maternal Grandmother   . Arthritis Maternal Grandfather   . Cancer Maternal Grandfather   . Heart attack Maternal Grandfather   . Arthritis Paternal Grandmother   .  Alcohol abuse Paternal Grandmother   . Cancer Paternal Grandmother   . COPD Paternal Grandmother   . Diabetes Paternal Grandmother   . Heart attack Paternal Grandmother   . Hearing loss Paternal Grandmother   . Hypertension Paternal Grandmother   . Arthritis Paternal Grandfather   . Alcohol abuse Paternal Grandfather   . Cancer Paternal Grandfather   . COPD Paternal Grandfather   . Diabetes Paternal Grandfather   . Heart attack Paternal Grandfather   . Heart disease Paternal Grandfather   . Hearing loss Paternal Grandfather   . Hypertension Paternal Grandfather   . Stroke Paternal Grandfather   . Birth defects Sister     Social History   Socioeconomic History  . Marital status: Married    Spouse name: Not on file  .  Number of children: 1  . Years of education: Not on file  . Highest education level: Some college, no degree  Occupational History  . Not on file  Social Needs  . Financial resource strain: Not hard at all  . Food insecurity:    Worry: Never true    Inability: Never true  . Transportation needs:    Medical: No    Non-medical: No  Tobacco Use  . Smoking status: Former Research scientist (life sciences)  . Smokeless tobacco: Never Used  Substance and Sexual Activity  . Alcohol use: Yes    Comment: rare  . Drug use: Never  . Sexual activity: Yes    Birth control/protection: None  Lifestyle  . Physical activity:    Days per week: 0 days    Minutes per session: 0 min  . Stress: Very much  Relationships  . Social connections:    Talks on phone: More than three times a week    Gets together: Once a week    Attends religious service: Never    Active member of club or organization: No    Attends meetings of clubs or organizations: Never    Relationship status: Married  . Intimate partner violence:    Fear of current or ex partner: No    Emotionally abused: No    Physically abused: No    Forced sexual activity: No  Other Topics Concern  . Not on file  Social History Narrative  . Not on file     Current Outpatient Medications:  .  albuterol (PROVENTIL HFA;VENTOLIN HFA) 108 (90 Base) MCG/ACT inhaler, Inhale 2 puffs into the lungs every 6 (six) hours as needed for wheezing or shortness of breath., Disp: 1 Inhaler, Rfl: 1 .  diclofenac sodium (VOLTAREN) 1 % GEL, Apply 2 g topically 4 (four) times daily., Disp: 100 g, Rfl: 1 .  eletriptan (RELPAX) 40 MG tablet, TAKE ONE TABLET (40 MG DOSE) BY MOUTH EVERY 2 (TWO) HOURS AS NEEDED (MAX 2/24HOURS)., Disp: , Rfl: 5 .  esomeprazole (NEXIUM) 40 MG capsule, Take 40 mg by mouth daily at 12 noon., Disp: , Rfl:  .  Galcanezumab-gnlm 120 MG/ML SOAJ, Inject 1 mL into the skin every 30 (thirty) days., Disp: , Rfl:  .  Misc. Devices MISC, Nurtec ODT 75 mg one po at onset  of migraine, Disp: , Rfl:  .  montelukast (SINGULAIR) 10 MG tablet, Take 1 tablet (10 mg total) by mouth at bedtime., Disp: 30 tablet, Rfl: 3 .  promethazine (PHENERGAN) 25 MG tablet, Take 25 mg by mouth every 6 (six) hours as needed for nausea or vomiting., Disp: , Rfl:  .  venlafaxine (EFFEXOR) 75 MG tablet, TAKE 1  TABLET (75 MG TOTAL) BY MOUTH 2 (TWO) TIMES DAILY WITH A MEAL., Disp: 180 tablet, Rfl: 3  EXAM:  VITALS per patient if applicable: Pulse 73 Comment: snartwatch , pt provided.  Temp 97.6 F (36.4 C) (Oral)   Ht 6\' 1"  (1.854 m)   Wt 289 lb 3.9 oz (131.2 kg) Comment: Pt provided from hm  BMI 38.16 kg/m   GENERAL: alert, oriented, appears well and in no acute distress  HEENT: atraumatic, conjunttiva clear, no obvious abnormalities on inspection of external nose and ears  NECK: normal movements of the head and neck  LUNGS: on inspection no signs of respiratory distress, breathing rate appears normal, no obvious gross SOB, gasping or wheezing  CV: no obvious cyanosis  MS: moves all visible extremities without noticeable abnormality  PSYCH/NEURO: pleasant and cooperative, no obvious depression or anxiety, speech and thought processing grossly intact  ASSESSMENT AND PLAN:  Discussed the following assessment and plan:  Mild persistent asthma -Not well controlled -D/c singulair due to side effects -Start breo daily -He will contact me via mychart to let me know how he is feeling in a few weeks.        I discussed the assessment and treatment plan with the patient. The patient was provided an opportunity to ask questions and all were answered. The patient agreed with the plan and demonstrated an understanding of the instructions.   The patient was advised to call back or seek an in-person evaluation if the symptoms worsen or if the condition fails to improve as anticipated.     Luetta Nutting, DO

## 2019-02-25 DIAGNOSIS — H8192 Unspecified disorder of vestibular function, left ear: Secondary | ICD-10-CM | POA: Diagnosis not present

## 2019-03-04 DIAGNOSIS — H8192 Unspecified disorder of vestibular function, left ear: Secondary | ICD-10-CM | POA: Diagnosis not present

## 2019-03-08 ENCOUNTER — Other Ambulatory Visit: Payer: Self-pay | Admitting: Family Medicine

## 2019-05-15 ENCOUNTER — Encounter: Payer: Self-pay | Admitting: Family Medicine

## 2019-05-20 ENCOUNTER — Other Ambulatory Visit: Payer: Self-pay | Admitting: Family Medicine

## 2019-05-20 ENCOUNTER — Other Ambulatory Visit: Payer: Self-pay

## 2019-05-20 ENCOUNTER — Encounter: Payer: Self-pay | Admitting: Family Medicine

## 2019-05-20 ENCOUNTER — Telehealth (INDEPENDENT_AMBULATORY_CARE_PROVIDER_SITE_OTHER): Payer: Managed Care, Other (non HMO) | Admitting: Family Medicine

## 2019-05-20 DIAGNOSIS — F339 Major depressive disorder, recurrent, unspecified: Secondary | ICD-10-CM

## 2019-05-20 MED ORDER — VORTIOXETINE HBR 10 MG PO TABS: 10.0000 mg | ORAL_TABLET | Freq: Every day | ORAL | 1 refills | Status: DC

## 2019-05-20 NOTE — Assessment & Plan Note (Signed)
-  Worsening depression and anxiety.   -Score of 3 regarding suicidal ideation.  Discussed with him and he denies any immediate feeling of SI or HI but has had fleeting thoughts previously.  He denies any plan or intent to harm himself or anyone else at this time but does have some passive thoughts about not existing.  He is aware to call 911 if these thoughts change or worsen.   -Discussed additional options for management of symptoms.  I do think getting back in with a therapist is ideal.   Will start trintellix as he has not had significant improvement on several other medications listed in HPI.  We'll plan to follow up in 4 weeks.  We may need to augment this as well with bupropion or Abilify depending on his response.

## 2019-05-20 NOTE — Progress Notes (Signed)
Done

## 2019-05-20 NOTE — Progress Notes (Signed)
Rodney Cherry. - 38 y.o. male MRN [id number]  Date of birth: 02/02/1981   This visit type was conducted due to national recommendations for restrictions regarding the COVID-19 Pandemic (e.g. social distancing).  This format is felt to be most appropriate for this patient at this time.  All issues noted in this document were discussed and addressed.  No physical exam was performed (except for noted visual exam findings with Video Visits).  I discussed the limitations of evaluation and management by telemedicine and the availability of in person appointments. The patient expressed understanding and agreed to proceed.  I connected with@ on 05/20/19 at  9:30 AM EDT by a video enabled telemedicine application and verified that I am speaking with the correct person using two identifiers.   Patient Location: Home [address] [place] [address]   Provider location:   Home office  Chief Complaint  Patient presents with  . Follow-up    depression and anxiety---getting worse--getting new therapist.     HPI  Rodney Cherry. is a 39 y.o. male who presents via audio/video conferencing for a telehealth visit today.  He is following up today for depression and anxiety.  He reports worsening symptoms of depression and anxiety over the past several weeks.  He had been seeing a psychiatrist but doesn't feel like he is making very much progress.  He was also seeing a therapist however she moved out of state.  He is currently awaiting getting in with a new therapist.  In regards to medication he has tried several including lexapro, celexa, zoloft, paxil, prozac, bupropion and most recently effexor.  None of these have really ever improved his symptoms and many have made his symptoms worse. He is open to trying other medications.    Depression screen [facility] 2/9 05/20/2019 07/27/2018 01/09/2018  Decreased Interest 3 1 3   Down, Depressed, Hopeless 3 2 2   PHQ - 2 Score 6 3 5   Altered sleeping 3 2 3   Tired,  decreased energy 3 2 3   Change in appetite 3 2 3   Feeling bad or failure about yourself  3 2 3   Trouble concentrating 3 2 -  Moving slowly or fidgety/restless 3 1 2   Suicidal thoughts 3 1 2   PHQ-9 Score 27 15 21    GAD 7 : Generalized Anxiety Score 05/20/2019 07/27/2018 01/09/2018  Nervous, Anxious, on Edge 3 2 2   Control/stop worrying 3 2 3   Worry too much - different things 3 2 3   Trouble relaxing 3 2 3   Restless 3 2 3   Easily annoyed or irritable 3 0 3  Afraid - awful might happen 3 1 3   Total GAD 7 Score 21 11 20       ROS:  A comprehensive ROS was completed and negative except as noted per HPI  Past Medical History:  Diagnosis Date  . Allergy   . Asthma   . Depression   . GERD (gastroesophageal reflux disease)   . Migraine   . Mild intermittent asthma 01/29/2018  . Seizures ([place])     Past Surgical History:  Procedure Laterality Date  . APPENDECTOMY    . CHOLECYSTECTOMY    . NASAL SEPTUM SURGERY    . VASECTOMY    . WISDOM TOOTH EXTRACTION      Family History  Problem Relation Age of Onset  . Arthritis Mother   . Diabetes Mother   . Hearing loss Mother   . Hyperlipidemia Mother   . Hypertension Mother   .  Kidney disease Mother   . Alcohol abuse Father   . Arthritis Father   . Asthma Father   . Cancer Father   . Depression Father   . Drug abuse Father   . Hearing loss Father   . Heart attack Father   . Heart disease Father   . Hyperlipidemia Father   . Hypertension Father   . Kidney disease Father   . Stroke Father   . Stroke Sister   . Depression Sister   . Diabetes Sister   . Hearing loss Sister   . Kidney disease Sister   . Learning disabilities Sister   . Mental illness Sister   . Asthma Daughter   . Arthritis Maternal Grandmother   . Cancer Maternal Grandmother   . Diabetes Maternal Grandmother   . Hypertension Maternal Grandmother   . Kidney disease Maternal Grandmother   . Stroke Maternal Grandmother   . Arthritis Maternal Grandfather    . Cancer Maternal Grandfather   . Heart attack Maternal Grandfather   . Arthritis Paternal Grandmother   . Alcohol abuse Paternal Grandmother   . Cancer Paternal Grandmother   . COPD Paternal Grandmother   . Diabetes Paternal Grandmother   . Heart attack Paternal Grandmother   . Hearing loss Paternal Grandmother   . Hypertension Paternal Grandmother   . Arthritis Paternal Grandfather   . Alcohol abuse Paternal Grandfather   . Cancer Paternal Grandfather   . COPD Paternal Grandfather   . Diabetes Paternal Grandfather   . Heart attack Paternal Grandfather   . Heart disease Paternal Grandfather   . Hearing loss Paternal Grandfather   . Hypertension Paternal Grandfather   . Stroke Paternal Grandfather   . Birth defects Sister     Social History   Socioeconomic History  . Marital status: Married    Spouse name: Not on file  . Number of children: 1  . Years of education: Not on file  . Highest education level: Some college, no degree  Occupational History  . Not on file  Social Needs  . Financial resource strain: Not hard at all  . Food insecurity    Worry: Never true    Inability: Never true  . Transportation needs    Medical: No    Non-medical: No  Tobacco Use  . Smoking status: Former Research scientist (life sciences)  . Smokeless tobacco: Never Used  Substance and Sexual Activity  . Alcohol use: Yes    Comment: rare  . Drug use: Never  . Sexual activity: Yes    Birth control/protection: None  Lifestyle  . Physical activity    Days per week: 0 days    Minutes per session: 0 min  . Stress: Very much  Relationships  . Social connections    Talks on phone: More than three times a week    Gets together: Once a week    Attends religious service: Never    Active member of club or organization: No    Attends meetings of clubs or organizations: Never    Relationship status: Married  . Intimate partner violence    Fear of current or ex partner: No    Emotionally abused: No    Physically  abused: No    Forced sexual activity: No  Other Topics Concern  . Not on file  Social History Narrative  . Not on file     Current Outpatient Medications:  .  albuterol (PROVENTIL HFA;VENTOLIN HFA) 108 (90 Base) MCG/ACT inhaler, Inhale 2 puffs into  the lungs every 6 (six) hours as needed for wheezing or shortness of breath., Disp: 1 Inhaler, Rfl: 1 .  diclofenac sodium (VOLTAREN) 1 % GEL, Apply 2 g topically 4 (four) times daily., Disp: 100 g, Rfl: 1 .  esomeprazole (NEXIUM) 40 MG capsule, Take 40 mg by mouth daily at 12 noon., Disp: , Rfl:  .  fluticasone furoate-vilanterol (BREO ELLIPTA) 100-25 MCG/INH AEPB, Inhale 1 puff into the lungs daily., Disp: 1 each, Rfl: 6 .  Galcanezumab-gnlm 120 MG/ML SOAJ, Inject 1 mL into the skin every 30 (thirty) days., Disp: , Rfl:  .  Misc. Devices MISC, Nurtec ODT 75 mg one po at onset of migraine, Disp: , Rfl:  .  promethazine (PHENERGAN) 25 MG tablet, Take 25 mg by mouth every 6 (six) hours as needed for nausea or vomiting., Disp: , Rfl:  .  eletriptan (RELPAX) 40 MG tablet, TAKE ONE TABLET (40 MG DOSE) BY MOUTH EVERY 2 (TWO) HOURS AS NEEDED (MAX 2/24HOURS)., Disp: , Rfl: 5 .  montelukast (SINGULAIR) 10 MG tablet, TAKE 1 TABLET BY MOUTH EVERYDAY AT BEDTIME (Patient not taking: Reported on 05/20/2019), Disp: 90 tablet, Rfl: 1 .  venlafaxine (EFFEXOR) 75 MG tablet, TAKE 1 TABLET (75 MG TOTAL) BY MOUTH 2 (TWO) TIMES DAILY WITH A MEAL. (Patient not taking: Reported on 05/20/2019), Disp: 180 tablet, Rfl: 3 .  vortioxetine HBr (TRINTELLIX) 10 MG TABS tablet, Take 1 tablet (10 mg total) by mouth daily., Disp: 30 tablet, Rfl: 1  EXAM:  VITALS per patient if applicable: BP (!) 809/983 Comment: pt report  Pulse 88 Comment: pt report  Ht 6\' 1"  (1.854 m)   Wt 284 lb 2.8 oz (128.9 kg) Comment: pt report  BMI 37.49 kg/m   GENERAL: alert, oriented, appears well and in no acute distress  HEENT: atraumatic, conjunttiva clear, no obvious abnormalities on inspection  of external nose and ears  NECK: normal movements of the head and neck  LUNGS: on inspection no signs of respiratory distress, breathing rate appears normal, no obvious gross SOB, gasping or wheezing  CV: no obvious cyanosis  MS: moves all visible extremities without noticeable abnormality  PSYCH/NEURO: pleasant and cooperative, speech and thought processing grossly intact  ASSESSMENT AND PLAN:  Discussed the following assessment and plan:  Depression, 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   -Score of 3 regarding suicidal ideation.  Discussed with him and he denies any immediate feeling of SI or HI but has had fleeting thoughts previously.  He denies any plan or intent to harm himself or anyone else at this time but does have some passive thoughts about not existing.  He is aware to call 911 if these thoughts change or worsen.   -Discussed additional options for management of symptoms.  I do think getting back in with a therapist is ideal.   Will start trintellix as he has not had significant improvement on several other medications listed in HPI.  We'll plan to follow up in 4 weeks.  We may need to augment this as well with bupropion or Abilify depending on his response.   >25 minutes spent with patient with 50% of time spent providing counseling as outlined above.       I discussed the assessment and treatment plan with the patient. The patient was provided an opportunity to ask questions and all were answered. The patient agreed with the plan and demonstrated an understanding of the instructions.   The patient was advised to call back or seek  an in-person evaluation if the symptoms worsen or if the condition fails to improve as anticipated.   Luetta Nutting, DO

## 2019-06-01 ENCOUNTER — Encounter: Payer: Self-pay | Admitting: Family Medicine

## 2019-06-09 ENCOUNTER — Other Ambulatory Visit: Payer: Self-pay | Admitting: Family Medicine

## 2019-06-09 MED ORDER — LATUDA 20 MG PO TABS: 20.0000 mg | ORAL_TABLET | Freq: Every day | ORAL | 1 refills | Status: DC

## 2019-06-11 DIAGNOSIS — N200 Calculus of kidney: Secondary | ICD-10-CM | POA: Diagnosis not present

## 2019-06-17 ENCOUNTER — Telehealth (INDEPENDENT_AMBULATORY_CARE_PROVIDER_SITE_OTHER): Payer: Managed Care, Other (non HMO) | Admitting: Family Medicine

## 2019-06-17 ENCOUNTER — Encounter: Payer: Self-pay | Admitting: Family Medicine

## 2019-06-17 ENCOUNTER — Other Ambulatory Visit: Payer: Self-pay

## 2019-06-17 DIAGNOSIS — F339 Major depressive disorder, recurrent, unspecified: Secondary | ICD-10-CM | POA: Diagnosis not present

## 2019-06-17 NOTE — Assessment & Plan Note (Signed)
-  Stable with some improvement since starting latuda.  No side effects at this time.  Will plan to continue for now with f/u in 4-6 weeks.  He will contact me if needing to follow up sooner for any reason.

## 2019-06-17 NOTE — Progress Notes (Signed)
Rodney Cherry. - 38 y.o. male MRN [id number]  Date of birth: 20-Aug-1981   This visit type was conducted due to national recommendations for restrictions regarding the COVID-19 Pandemic (e.g. social distancing).  This format is felt to be most appropriate for this patient at this time.  All issues noted in this document were discussed and addressed.  No physical exam was performed (except for noted visual exam findings with Video Visits).  I discussed the limitations of evaluation and management by telemedicine and the availability of in person appointments. The patient expressed understanding and agreed to proceed.  I connected with@ on 06/17/19 at  9:40 AM EDT by a video enabled telemedicine application and verified that I am speaking with the correct person using two identifiers.   Patient Location: Home [address] [place] [address]   Provider location:   Home office  Chief Complaint  Patient presents with  . Follow-up    4 wk follow up on depression and anxiety---doing good with med.     HPI  Rodney Bevilacqua. is a 38 y.o. male who presents via audio/video conferencing for a telehealth visit today.  He is following up today for depression.  He was started on trintellix at last visit however this increased the frequency of his migraines.  He has tried several medications in the past, both typical and atypical antidepressants.  After discussion over mychart recently we decided to try latuda.  He reports he is doing well with this, denies side effects at this time.   He denies SI or HI at this time.  ROS:  A comprehensive ROS was completed and negative except as noted per HPI  Past Medical History:  Diagnosis Date  . Allergy   . Asthma   . Depression   . GERD (gastroesophageal reflux disease)   . Migraine   . Mild intermittent asthma 01/29/2018  . Seizures ([place])     Past Surgical History:  Procedure Laterality Date  . APPENDECTOMY    . CHOLECYSTECTOMY    . NASAL SEPTUM  SURGERY    . VASECTOMY    . WISDOM TOOTH EXTRACTION      Family History  Problem Relation Age of Onset  . Arthritis Mother   . Diabetes Mother   . Hearing loss Mother   . Hyperlipidemia Mother   . Hypertension Mother   . Kidney disease Mother   . Alcohol abuse Father   . Arthritis Father   . Asthma Father   . Cancer Father   . Depression Father   . Drug abuse Father   . Hearing loss Father   . Heart attack Father   . Heart disease Father   . Hyperlipidemia Father   . Hypertension Father   . Kidney disease Father   . Stroke Father   . Stroke Sister   . Depression Sister   . Diabetes Sister   . Hearing loss Sister   . Kidney disease Sister   . Learning disabilities Sister   . Mental illness Sister   . Asthma Daughter   . Arthritis Maternal Grandmother   . Cancer Maternal Grandmother   . Diabetes Maternal Grandmother   . Hypertension Maternal Grandmother   . Kidney disease Maternal Grandmother   . Stroke Maternal Grandmother   . Arthritis Maternal Grandfather   . Cancer Maternal Grandfather   . Heart attack Maternal Grandfather   . Arthritis Paternal Grandmother   . Alcohol abuse Paternal Grandmother   .  Cancer Paternal Grandmother   . COPD Paternal Grandmother   . Diabetes Paternal Grandmother   . Heart attack Paternal Grandmother   . Hearing loss Paternal Grandmother   . Hypertension Paternal Grandmother   . Arthritis Paternal Grandfather   . Alcohol abuse Paternal Grandfather   . Cancer Paternal Grandfather   . COPD Paternal Grandfather   . Diabetes Paternal Grandfather   . Heart attack Paternal Grandfather   . Heart disease Paternal Grandfather   . Hearing loss Paternal Grandfather   . Hypertension Paternal Grandfather   . Stroke Paternal Grandfather   . Birth defects Sister     Social History   Socioeconomic History  . Marital status: Married    Spouse name: Not on file  . Number of children: 1  . Years of education: Not on file  . Highest  education level: Some college, no degree  Occupational History  . Not on file  Social Needs  . Financial resource strain: Not hard at all  . Food insecurity    Worry: Never true    Inability: Never true  . Transportation needs    Medical: No    Non-medical: No  Tobacco Use  . Smoking status: Former Research scientist (life sciences)  . Smokeless tobacco: Never Used  Substance and Sexual Activity  . Alcohol use: Yes    Comment: rare  . Drug use: Never  . Sexual activity: Yes    Birth control/protection: None  Lifestyle  . Physical activity    Days per week: 0 days    Minutes per session: 0 min  . Stress: Very much  Relationships  . Social connections    Talks on phone: More than three times a week    Gets together: Once a week    Attends religious service: Never    Active member of club or organization: No    Attends meetings of clubs or organizations: Never    Relationship status: Married  . Intimate partner violence    Fear of current or ex partner: No    Emotionally abused: No    Physically abused: No    Forced sexual activity: No  Other Topics Concern  . Not on file  Social History Narrative  . Not on file     Current Outpatient Medications:  .  albuterol (PROVENTIL HFA;VENTOLIN HFA) 108 (90 Base) MCG/ACT inhaler, Inhale 2 puffs into the lungs every 6 (six) hours as needed for wheezing or shortness of breath., Disp: 1 Inhaler, Rfl: 1 .  diclofenac sodium (VOLTAREN) 1 % GEL, Apply 2 g topically 4 (four) times daily., Disp: 100 g, Rfl: 1 .  esomeprazole (NEXIUM) 40 MG capsule, Take 40 mg by mouth daily at 12 noon., Disp: , Rfl:  .  fluticasone furoate-vilanterol (BREO ELLIPTA) 100-25 MCG/INH AEPB, Inhale 1 puff into the lungs daily., Disp: 1 each, Rfl: 6 .  Galcanezumab-gnlm 120 MG/ML SOAJ, Inject 1 mL into the skin every 30 (thirty) days., Disp: , Rfl:  .  lurasidone (LATUDA) 20 MG TABS tablet, Take 1 tablet (20 mg total) by mouth at bedtime., Disp: 30 tablet, Rfl: 1 .  Misc. Devices MISC,  Nurtec ODT 75 mg one po at onset of migraine, Disp: , Rfl:  .  promethazine (PHENERGAN) 25 MG tablet, Take 25 mg by mouth every 6 (six) hours as needed for nausea or vomiting., Disp: , Rfl:   EXAM:  VITALS per patient if applicable: BP [id number] Comment: pt reprt  Pulse 80 Comment: pt reprt  Ht  6\' 1"  (1.854 m)   Wt 285 lb 4.4 oz (129.4 kg) Comment: pt reprt  BMI 37.64 kg/m   GENERAL: alert, oriented, appears well and in no acute distress  HEENT: atraumatic, conjunttiva clear, no obvious abnormalities on inspection of external nose and ears  NECK: normal movements of the head and neck  LUNGS: on inspection no signs of respiratory distress, breathing rate appears normal, no obvious gross SOB, gasping or wheezing  CV: no obvious cyanosis  MS: moves all visible extremities without noticeable abnormality  PSYCH/NEURO: pleasant and cooperative, no obvious depression or anxiety, speech and thought processing grossly intact  ASSESSMENT AND PLAN:  Discussed the following assessment and plan:  Depression, recurrent (HCC) -Stable with some improvement since starting latuda.  No side effects at this time.  Will plan to continue for now with f/u in 4-6 weeks.  He will contact me if needing to follow up sooner for any reason.        I discussed the assessment and treatment plan with the patient. The patient was provided an opportunity to ask questions and all were answered. The patient agreed with the plan and demonstrated an understanding of the instructions.   The patient was advised to call back or seek an in-person evaluation if the symptoms worsen or if the condition fails to improve as anticipated.    Luetta Nutting, DO

## 2019-06-23 DIAGNOSIS — N200 Calculus of kidney: Secondary | ICD-10-CM | POA: Diagnosis not present

## 2019-07-15 DIAGNOSIS — G43709 Chronic migraine without aura, not intractable, without status migrainosus: Secondary | ICD-10-CM | POA: Diagnosis not present

## 2019-08-04 ENCOUNTER — Other Ambulatory Visit: Payer: Self-pay | Admitting: Family Medicine

## 2019-08-04 ENCOUNTER — Encounter: Payer: Self-pay | Admitting: Family Medicine

## 2019-08-04 MED ORDER — LATUDA 20 MG PO TABS: 20.0000 mg | ORAL_TABLET | Freq: Every day | ORAL | 3 refills | Status: DC

## 2019-08-19 ENCOUNTER — Encounter: Payer: Self-pay | Admitting: Family Medicine

## 2019-08-20 ENCOUNTER — Other Ambulatory Visit: Payer: Self-pay | Admitting: Family Medicine

## 2019-08-20 DIAGNOSIS — G894 Chronic pain syndrome: Secondary | ICD-10-CM

## 2019-08-20 NOTE — Telephone Encounter (Signed)
Referral entered  

## 2019-08-20 NOTE — Telephone Encounter (Signed)
Where all is he having pain so that I can include in referral

## 2019-10-11 DIAGNOSIS — F314 Bipolar disorder, current episode depressed, severe, without psychotic features: Secondary | ICD-10-CM | POA: Diagnosis not present

## 2019-10-13 DIAGNOSIS — F314 Bipolar disorder, current episode depressed, severe, without psychotic features: Secondary | ICD-10-CM | POA: Diagnosis not present

## 2019-10-14 DIAGNOSIS — F314 Bipolar disorder, current episode depressed, severe, without psychotic features: Secondary | ICD-10-CM | POA: Diagnosis not present

## 2019-10-15 DIAGNOSIS — F314 Bipolar disorder, current episode depressed, severe, without psychotic features: Secondary | ICD-10-CM | POA: Diagnosis not present

## 2019-10-18 ENCOUNTER — Telehealth: Payer: Self-pay | Admitting: Family Medicine

## 2019-10-18 DIAGNOSIS — F314 Bipolar disorder, current episode depressed, severe, without psychotic features: Secondary | ICD-10-CM | POA: Diagnosis not present

## 2019-10-18 NOTE — Telephone Encounter (Signed)
Patient is calling and wanted TOC from Dr. Zigmund Daniel to Dr. Bryan Lemma due to Dr. Zigmund Daniel leaving the practice.CB is [phone].

## 2019-10-20 DIAGNOSIS — F314 Bipolar disorder, current episode depressed, severe, without psychotic features: Secondary | ICD-10-CM | POA: Diagnosis not present

## 2019-10-20 NOTE — Telephone Encounter (Signed)
Clerical staff will need to coordinate pt's TOC

## 2019-10-20 NOTE — Telephone Encounter (Signed)
Ok with me 

## 2019-10-22 ENCOUNTER — Other Ambulatory Visit: Payer: Self-pay | Admitting: Family Medicine

## 2019-10-22 ENCOUNTER — Encounter: Payer: Self-pay | Admitting: Family Medicine

## 2019-10-22 DIAGNOSIS — F314 Bipolar disorder, current episode depressed, severe, without psychotic features: Secondary | ICD-10-CM | POA: Diagnosis not present

## 2019-10-22 MED ORDER — PANTOPRAZOLE SODIUM 40 MG PO TBEC: 40.0000 mg | DELAYED_RELEASE_TABLET | Freq: Every day | ORAL | 3 refills | Status: DC

## 2019-10-22 NOTE — Telephone Encounter (Signed)
Scheduled

## 2019-10-22 NOTE — Telephone Encounter (Signed)
Rx sent in

## 2019-10-25 DIAGNOSIS — F314 Bipolar disorder, current episode depressed, severe, without psychotic features: Secondary | ICD-10-CM | POA: Diagnosis not present

## 2019-10-27 DIAGNOSIS — F314 Bipolar disorder, current episode depressed, severe, without psychotic features: Secondary | ICD-10-CM | POA: Diagnosis not present

## 2019-10-29 DIAGNOSIS — F314 Bipolar disorder, current episode depressed, severe, without psychotic features: Secondary | ICD-10-CM | POA: Diagnosis not present

## 2019-11-01 DIAGNOSIS — F314 Bipolar disorder, current episode depressed, severe, without psychotic features: Secondary | ICD-10-CM | POA: Diagnosis not present

## 2019-11-03 DIAGNOSIS — F314 Bipolar disorder, current episode depressed, severe, without psychotic features: Secondary | ICD-10-CM | POA: Diagnosis not present

## 2019-11-05 DIAGNOSIS — F314 Bipolar disorder, current episode depressed, severe, without psychotic features: Secondary | ICD-10-CM | POA: Diagnosis not present

## 2019-11-08 DIAGNOSIS — F319 Bipolar disorder, unspecified: Secondary | ICD-10-CM | POA: Diagnosis not present

## 2019-11-10 DIAGNOSIS — F319 Bipolar disorder, unspecified: Secondary | ICD-10-CM | POA: Diagnosis not present

## 2019-11-12 DIAGNOSIS — F319 Bipolar disorder, unspecified: Secondary | ICD-10-CM | POA: Diagnosis not present

## 2019-11-15 DIAGNOSIS — F319 Bipolar disorder, unspecified: Secondary | ICD-10-CM | POA: Diagnosis not present

## 2019-11-17 DIAGNOSIS — F319 Bipolar disorder, unspecified: Secondary | ICD-10-CM | POA: Diagnosis not present

## 2019-11-19 DIAGNOSIS — F319 Bipolar disorder, unspecified: Secondary | ICD-10-CM | POA: Diagnosis not present

## 2019-11-26 DIAGNOSIS — F319 Bipolar disorder, unspecified: Secondary | ICD-10-CM | POA: Diagnosis not present

## 2019-11-29 DIAGNOSIS — F319 Bipolar disorder, unspecified: Secondary | ICD-10-CM | POA: Diagnosis not present

## 2019-12-16 ENCOUNTER — Other Ambulatory Visit: Payer: Self-pay

## 2019-12-16 ENCOUNTER — Encounter: Payer: Managed Care, Other (non HMO) | Admitting: Family Medicine

## 2019-12-16 NOTE — Patient Instructions (Addendum)
Health Maintenance Due  Topic Date Due  . HIV Screening  Never done  . INFLUENZA VACCINE  05/08/2019    Depression screen [facility] 2/9 05/20/2019 07/27/2018 01/09/2018  Decreased Interest 3 1 3   Down, Depressed, Hopeless 3 2 2   PHQ - 2 Score 6 3 5   Altered sleeping 3 2 3   Tired, decreased energy 3 2 3   Change in appetite 3 2 3   Feeling bad or failure about yourself  3 2 3   Trouble concentrating 3 2 -  Moving slowly or fidgety/restless 3 1 2   Suicidal thoughts 3 1 2   PHQ-9 Score 27 15 21     Health Maintenance, Male Adopting a healthy lifestyle and getting preventive care are important in promoting health and wellness. Ask your health care provider about:  The right schedule for you to have regular tests and exams.  Things you can do on your own to prevent diseases and keep yourself healthy. What should I know about diet, weight, and exercise? Eat a healthy diet   Eat a diet that includes plenty of vegetables, fruits, low-fat dairy products, and lean protein.  Do not eat a lot of foods that are high in solid fats, added sugars, or sodium. Maintain a healthy weight Body mass index (BMI) is a measurement that can be used to identify possible weight problems. It estimates body fat based on height and weight. Your health care provider can help determine your BMI and help you achieve or maintain a healthy weight. Get regular exercise Get regular exercise. This is one of the most important things you can do for your health. Most adults should:  Exercise for at least 150 minutes each week. The exercise should increase your heart rate and make you sweat (moderate-intensity exercise).  Do strengthening exercises at least twice a week. This is in addition to the moderate-intensity exercise.  Spend less time sitting. Even light physical activity can be beneficial. Watch cholesterol and blood lipids Have your blood tested for lipids and cholesterol at 39 years of age, then have this test every  5 years. You may need to have your cholesterol levels checked more often if:  Your lipid or cholesterol levels are high.  You are older than 39 years of age.  You are at high risk for heart disease. What should I know about cancer screening? Many types of cancers can be detected early and may often be prevented. Depending on your health history and family history, you may need to have cancer screening at various ages. This may include screening for:  Colorectal cancer.  Prostate cancer.  Skin cancer.  Lung cancer. What should I know about heart disease, diabetes, and high blood pressure? Blood pressure and heart disease  High blood pressure causes heart disease and increases the risk of stroke. This is more likely to develop in people who have high blood pressure readings, are of African descent, or are overweight.  Talk with your health care provider about your target blood pressure readings.  Have your blood pressure checked: ? Every 3-5 years if you are 73-21 years of age. ? Every year if you are 47 years old or older.  If you are between the ages of 73 and 57 and are a current or former smoker, ask your health care provider if you should have a one-time screening for abdominal aortic aneurysm (AAA). Diabetes Have regular diabetes screenings. This checks your fasting blood sugar level. Have the screening done:  Once every three years after age  45 if you are at a normal weight and have a low risk for diabetes.  More often and at a younger age if you are overweight or have a high risk for diabetes. What should I know about preventing infection? Hepatitis B If you have a higher risk for hepatitis B, you should be screened for this virus. Talk with your health care provider to find out if you are at risk for hepatitis B infection. Hepatitis C Blood testing is recommended for:  Everyone born from [birth year] through 1965.  Anyone with known risk factors for hepatitis C. Sexually  transmitted infections (STIs)  You should be screened each year for STIs, including gonorrhea and chlamydia, if: ? You are sexually active and are younger than 39 years of age. ? You are older than 39 years of age and your health care provider tells you that you are at risk for this type of infection. ? Your sexual activity has changed since you were last screened, and you are at increased risk for chlamydia or gonorrhea. Ask your health care provider if you are at risk.  Ask your health care provider about whether you are at high risk for HIV. Your health care provider may recommend a prescription medicine to help prevent HIV infection. If you choose to take medicine to prevent HIV, you should first get tested for HIV. You should then be tested every 3 months for as long as you are taking the medicine. Follow these instructions at home: Lifestyle  Do not use any products that contain nicotine or tobacco, such as cigarettes, e-cigarettes, and chewing tobacco. If you need help quitting, ask your health care provider.  Do not use street drugs.  Do not share needles.  Ask your health care provider for help if you need support or information about quitting drugs. Alcohol use  Do not drink alcohol if your health care provider tells you not to drink.  If you drink alcohol: ? Limit how much you have to 0-2 drinks a day. ? Be aware of how much alcohol is in your drink. In the [place] one drink equals one 12 oz bottle of beer (355 mL), one 5 oz glass of wine (148 mL), or one 1 oz glass of hard liquor (44 mL). General instructions  Schedule regular health, dental, and eye exams.  Stay current with your vaccines.  Tell your health care provider if: ? You often feel depressed. ? You have ever been abused or do not feel safe at home. Summary  Adopting a healthy lifestyle and getting preventive care are important in promoting health and wellness.  Follow your health care provider's  instructions about healthy diet, exercising, and getting tested or screened for diseases.  Follow your health care provider's instructions on monitoring your cholesterol and blood pressure. This information is not intended to replace advice given to you by your health care provider. Make sure you discuss any questions you have with your health care provider. Document Revised: 09/16/2018 Document Reviewed: 09/16/2018 Elsevier Patient Education  2020 [employer].

## 2019-12-17 ENCOUNTER — Encounter: Payer: Self-pay | Admitting: Family Medicine

## 2019-12-17 ENCOUNTER — Encounter: Payer: Self-pay | Admitting: Gastroenterology

## 2019-12-17 ENCOUNTER — Ambulatory Visit (INDEPENDENT_AMBULATORY_CARE_PROVIDER_SITE_OTHER): Payer: BC Managed Care – PPO | Admitting: Family Medicine

## 2019-12-17 ENCOUNTER — Other Ambulatory Visit: Payer: Self-pay | Admitting: Family Medicine

## 2019-12-17 VITALS — BP 112/80 | HR 100 | Temp 98.4°F | Ht 73.0 in | Wt 293.0 lb

## 2019-12-17 DIAGNOSIS — R7989 Other specified abnormal findings of blood chemistry: Secondary | ICD-10-CM | POA: Diagnosis not present

## 2019-12-17 DIAGNOSIS — R569 Unspecified convulsions: Secondary | ICD-10-CM | POA: Diagnosis not present

## 2019-12-17 DIAGNOSIS — G43709 Chronic migraine without aura, not intractable, without status migrainosus: Secondary | ICD-10-CM

## 2019-12-17 DIAGNOSIS — Z23 Encounter for immunization: Secondary | ICD-10-CM | POA: Diagnosis not present

## 2019-12-17 DIAGNOSIS — Z0000 Encounter for general adult medical examination without abnormal findings: Secondary | ICD-10-CM | POA: Diagnosis not present

## 2019-12-17 DIAGNOSIS — E782 Mixed hyperlipidemia: Secondary | ICD-10-CM | POA: Diagnosis not present

## 2019-12-17 DIAGNOSIS — J4530 Mild persistent asthma, uncomplicated: Secondary | ICD-10-CM

## 2019-12-17 DIAGNOSIS — F339 Major depressive disorder, recurrent, unspecified: Secondary | ICD-10-CM | POA: Diagnosis not present

## 2019-12-17 DIAGNOSIS — E669 Obesity, unspecified: Secondary | ICD-10-CM

## 2019-12-17 DIAGNOSIS — K580 Irritable bowel syndrome with diarrhea: Secondary | ICD-10-CM

## 2019-12-17 DIAGNOSIS — R7301 Impaired fasting glucose: Secondary | ICD-10-CM

## 2019-12-17 DIAGNOSIS — IMO0002 Reserved for concepts with insufficient information to code with codable children: Secondary | ICD-10-CM

## 2019-12-17 DIAGNOSIS — Z6838 Body mass index (BMI) 38.0-38.9, adult: Secondary | ICD-10-CM

## 2019-12-17 LAB — COMPREHENSIVE METABOLIC PANEL
ALT: 35 U/L (ref 0–53)
AST: 27 U/L (ref 0–37)
Albumin: 4.3 g/dL (ref 3.5–5.2)
Alkaline Phosphatase: 72 U/L (ref 39–117)
BUN: 11 mg/dL (ref 6–23)
CO2: 25 mEq/L (ref 19–32)
Calcium: 9.7 mg/dL (ref 8.4–10.5)
Chloride: 102 mEq/L (ref 96–112)
Creatinine, Ser: 1.03 mg/dL (ref 0.40–1.50)
GFR: 80.42 mL/min (ref >60.00)
Glucose, Bld: 129 mg/dL — ABNORMAL HIGH (ref 70–99)
Potassium: 4.3 mEq/L (ref 3.5–5.1)
Sodium: 135 mEq/L (ref 135–145)
Total Bilirubin: 0.7 mg/dL (ref 0.2–1.2)
Total Protein: 7.3 g/dL (ref 6.0–8.3)

## 2019-12-17 LAB — LDL CHOLESTEROL, DIRECT: Direct LDL: 151 mg/dL

## 2019-12-17 LAB — T4, FREE: Free T4: 0.9 ng/dL (ref 0.60–1.60)

## 2019-12-17 LAB — LIPID PANEL
Cholesterol: 202 mg/dL — ABNORMAL HIGH (ref 0–200)
HDL: 43.1 mg/dL (ref >39.00)
NonHDL: 159.12
Total CHOL/HDL Ratio: 5
Triglycerides: 208 mg/dL — ABNORMAL HIGH (ref 0.0–149.0)
VLDL: 41.6 mg/dL — ABNORMAL HIGH (ref 0.0–40.0)

## 2019-12-17 LAB — TSH: TSH: 1.3 u[IU]/mL (ref 0.35–4.50)

## 2019-12-17 MED ORDER — LATUDA 40 MG PO TABS: 40.0000 mg | ORAL_TABLET | Freq: Every day | ORAL | 3 refills | Status: DC

## 2019-12-17 MED ORDER — LATUDA 20 MG PO TABS: 40.0000 mg | ORAL_TABLET | Freq: Every day | ORAL | 3 refills | Status: DC

## 2019-12-17 NOTE — Progress Notes (Signed)
Rodney Cherry. is a 39 y.o. male  Chief Complaint  Patient presents with  . Establish Care    Pt here for phyisical.  Pt fasting for lab work today.    HPI: Rodney Cherry. is a 39 y.o. male here for annual CPE, fasting labs. His previous PCP was Dr. Zigmund Daniel. Pt would like flu vaccine today.  Pt complains of few year h/o loose stools that occur during or after every meal. Not associated with type of food, quantity of food.  Pt has a h/o hemorrhoids so occasional blood in stool.  About 1/2 the time, pt experiences abdominal cramping that resolves after BM.  No fever, chills. No night sweats. No unintentional weight loss.  Pt has 3-5 BM per day. Unsure of fam h/o IBD, celiac disease. Wife has celiac.  He had a flex sig ~ 5 years ago when he had issues with appendix and GB.  Vision: UTD Dental: due - last cleaning 1 year ago Exercise: pt states he is unable to walk 10 min without getting dizzy ("vestibular issue"), if he does more strenuous exercise he is concerned about syncope or migraine - both have happened in the past  Med refills needed today: see orders  Specialists: neurology (Dr. Amil Amen - [facility])  Past Medical History:  Diagnosis Date  . Allergy   . Asthma   . Depression   . GERD (gastroesophageal reflux disease)   . Migraine   . Mild intermittent asthma 01/29/2018  . Seizures ([place])     Past Surgical History:  Procedure Laterality Date  . APPENDECTOMY    . CHOLECYSTECTOMY    . NASAL SEPTUM SURGERY    . VASECTOMY    . WISDOM TOOTH EXTRACTION      Social History   Socioeconomic History  . Marital status: Married    Spouse name: Not on file  . Number of children: 1  . Years of education: Not on file  . Highest education level: Some college, no degree  Occupational History  . Not on file  Tobacco Use  . Smoking status: Former Research scientist (life sciences)  . Smokeless tobacco: Never Used  Substance and Sexual Activity  . Alcohol use: Yes   Comment: rare  . Drug use: Never  . Sexual activity: Yes    Birth control/protection: None  Other Topics Concern  . Not on file  Social History Narrative  . Not on file   Social Determinants of Health   Financial Resource Strain:   . Difficulty of Paying Living Expenses:   Food Insecurity:   . Worried About Charity fundraiser in the Last Year:   . Arboriculturist in the Last Year:   Transportation Needs:   . Film/video editor (Medical):   Marland Kitchen Lack of Transportation (Non-Medical):   Physical Activity:   . Days of Exercise per Week:   . Minutes of Exercise per Session:   Stress:   . Feeling of Stress :   Social Connections:   . Frequency of Communication with Friends and Family:   . Frequency of Social Gatherings with Friends and Family:   . Attends Religious Services:   . Active Member of Clubs or Organizations:   . Attends Archivist Meetings:   Marland Kitchen Marital Status:   Intimate Partner Violence:   . Fear of Current or Ex-Partner:   . Emotionally Abused:   Marland Kitchen Physically Abused:   . Sexually Abused:  Family History  Problem Relation Age of Onset  . Arthritis Mother   . Diabetes Mother   . Hearing loss Mother   . Hyperlipidemia Mother   . Hypertension Mother   . Kidney disease Mother   . Alcohol abuse Father   . Arthritis Father   . Asthma Father   . Cancer Father   . Depression Father   . Drug abuse Father   . Hearing loss Father   . Heart attack Father   . Heart disease Father   . Hyperlipidemia Father   . Hypertension Father   . Kidney disease Father   . Stroke Father   . Stroke Sister   . Depression Sister   . Diabetes Sister   . Hearing loss Sister   . Kidney disease Sister   . Learning disabilities Sister   . Mental illness Sister   . Asthma Daughter   . Arthritis Maternal Grandmother   . Cancer Maternal Grandmother   . Diabetes Maternal Grandmother   . Hypertension Maternal Grandmother   . Kidney disease Maternal Grandmother   .  Stroke Maternal Grandmother   . Arthritis Maternal Grandfather   . Cancer Maternal Grandfather   . Heart attack Maternal Grandfather   . Arthritis Paternal Grandmother   . Alcohol abuse Paternal Grandmother   . Cancer Paternal Grandmother   . COPD Paternal Grandmother   . Diabetes Paternal Grandmother   . Heart attack Paternal Grandmother   . Hearing loss Paternal Grandmother   . Hypertension Paternal Grandmother   . Arthritis Paternal Grandfather   . Alcohol abuse Paternal Grandfather   . Cancer Paternal Grandfather   . COPD Paternal Grandfather   . Diabetes Paternal Grandfather   . Heart attack Paternal Grandfather   . Heart disease Paternal Grandfather   . Hearing loss Paternal Grandfather   . Hypertension Paternal Grandfather   . Stroke Paternal Grandfather   . Birth defects Sister      Immunization History  Administered Date(s) Administered  . Influenza,inj,Quad PF,6+ Mos 07/27/2018  . Tdap 01/29/2018    Outpatient Encounter Medications as of 12/17/2019  Medication Sig  . albuterol (PROVENTIL HFA;VENTOLIN HFA) 108 (90 Base) MCG/ACT inhaler Inhale 2 puffs into the lungs every 6 (six) hours as needed for wheezing or shortness of breath.  . fluticasone furoate-vilanterol (BREO ELLIPTA) 100-25 MCG/INH AEPB Inhale 1 puff into the lungs daily.  . Galcanezumab-gnlm (EMGALITY) 120 MG/ML SOAJ INJECT 1 ML INTO THE SKIN EVERY 30 (THIRTY) DAYS.  Marland Kitchen LATUDA 40 MG TABS tablet Take 40 mg by mouth at bedtime.  Marland Kitchen lurasidone (LATUDA) 20 MG TABS tablet Take 2 tablets (40 mg total) by mouth at bedtime.  . NURTEC 75 MG TBDP SMARTSIG:1 Tablet(s) By Mouth As Needed  . pantoprazole (PROTONIX) 40 MG tablet Take 1 tablet (40 mg total) by mouth daily.  . promethazine (PHENERGAN) 25 MG tablet Take 25 mg by mouth every 6 (six) hours as needed for nausea or vomiting.  . traZODone (DESYREL) 50 MG tablet Take 50 mg by mouth at bedtime as needed. for sleep  . [DISCONTINUED] Galcanezumab-gnlm 120 MG/ML  SOAJ Inject 1 mL into the skin every 30 (thirty) days.  . [DISCONTINUED] lurasidone (LATUDA) 20 MG TABS tablet Take 1 tablet (20 mg total) by mouth at bedtime. (Patient taking differently: Take 40 mg by mouth at bedtime. )  . [DISCONTINUED] Misc. Devices MISC Nurtec ODT 75 mg one po at onset of migraine  . diclofenac sodium (VOLTAREN) 1 % GEL Apply  2 g topically 4 (four) times daily. (Patient not taking: Reported on 12/17/2019)   No facility-administered encounter medications on file as of 12/17/2019.     ROS: Gen: no fever, chills  Skin: no rash, itching ENT: no ear pain, ear drainage, nasal congestion, rhinorrhea, sinus pressure, sore throat Eyes: no blurry vision, double vision Resp: no cough, wheeze,SOB CV: no CP, palpitations, LE edema,  GI: as above in HPI GU: no dysuria, urgency, frequency, hematuria; no testicular swelling or masses MSK: no joint pain, myalgias, back pain Neuro: + dizziness, + headache, weakness Psych: + depression, no anxiety, insomnia   Allergies  Allergen Reactions  . 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 Escitalopram Oxalate Rash  . Hydrocodone Other (See Comments)    Rebound headaches  . Latex Rash  . Morphine Other (See Comments)    Severe headaches Severe migraine   . Mushroom Extract Complex Other (See Comments)  . Other Other (See Comments)    All codeine medication increase migraine symptoms  . Sulfa Antibiotics Other (See Comments)    Causes glaucoma  VISION LOSS     BP 112/80 (BP Location: Right Arm, Patient Position: Sitting, Cuff Size: Large)   Pulse 100   Temp 98.4 F (36.9 C) (Temporal)   Ht 6\' 1"  (1.854 m)   Wt 293 lb (132.9 kg)   SpO2 96%   BMI 38.66 kg/m    Physical Exam  Constitutional: He is oriented to person, place, and time. He appears well-developed and well-nourished. No distress.  HENT:  Head: Normocephalic and atraumatic.  Right Ear: Tympanic  membrane and ear canal normal.  Left Ear: Tympanic membrane and ear canal normal.  Nose: Nose normal.  Mouth/Throat: Oropharynx is clear and moist and mucous membranes are normal.  Neck: No thyromegaly present.  Cardiovascular: Normal rate, regular rhythm, normal heart sounds and intact distal pulses.  Pulmonary/Chest: Effort normal and breath sounds normal. He has no wheezes. He has no rhonchi.  Abdominal: Soft. Bowel sounds are normal. He exhibits no distension and no mass. There is no abdominal tenderness. There is no rebound and no guarding.  Musculoskeletal:        General: No edema. Normal range of motion.     Cervical back: Neck supple.  Lymphadenopathy:    He has no cervical adenopathy.  Neurological: He is alert and oriented to person, place, and time. He exhibits normal muscle tone. Coordination normal.  Skin: Skin is warm and dry.  Psychiatric: He has a normal mood and affect.     A/P:  1. Annual physical exam - UTD on vision exam, due for dental exam - flu vaccine today, Tdap UTD - referral to [facility]  - Comprehensive metabolic panel - Lipid panel - TSH - T4, free - next CPE in 1 year  2. Seizure ([place]) - stable, not on meds, follows with neurology  3. Chronic migraine - follows with neuro/headache clinic at [facility]  4. Depression, recurrent ([place]) - stable, controlled Refill: - LATUDA 40 MG TABS tablet; Take 1 tablet (40 mg total) by mouth at bedtime.  Dispense: 90 tablet; Refill: 3  5. Mild persistent asthma without complication - stable on breo and PRN albuterol  6. Elevated TSH - TSH - T4, free - not on meds, no dx of hypothyroidism  7. Elevated liver function tests - Comprehensive metabolic panel  8. Mixed hyperlipidemia - Lipid panel  9. Irritable bowel syndrome with diarrhea - Ambulatory referral to Gastroenterology  10. Class 2 obesity with body mass index (BMI) of 38.0 to 38.9 in adult, unspecified obesity  type, unspecified whether serious comorbidity present - Amb Ref to [facility]  This visit occurred during the SARS-CoV-2 public health emergency.  Safety protocols were in place, including screening questions prior to the visit, additional usage of staff PPE, and extensive cleaning of exam room while observing appropriate contact time as indicated for disinfecting solutions.

## 2019-12-28 ENCOUNTER — Other Ambulatory Visit: Payer: Self-pay

## 2019-12-28 ENCOUNTER — Ambulatory Visit (INDEPENDENT_AMBULATORY_CARE_PROVIDER_SITE_OTHER): Payer: BC Managed Care – PPO | Admitting: Gastroenterology

## 2019-12-28 VITALS — BP 114/76 | HR 111 | Temp 98.9°F | Ht 73.0 in | Wt 295.5 lb

## 2019-12-28 DIAGNOSIS — R109 Unspecified abdominal pain: Secondary | ICD-10-CM | POA: Diagnosis not present

## 2019-12-28 DIAGNOSIS — R152 Fecal urgency: Secondary | ICD-10-CM

## 2019-12-28 DIAGNOSIS — R194 Change in bowel habit: Secondary | ICD-10-CM

## 2019-12-28 DIAGNOSIS — R197 Diarrhea, unspecified: Secondary | ICD-10-CM

## 2019-12-28 DIAGNOSIS — K921 Melena: Secondary | ICD-10-CM | POA: Diagnosis not present

## 2019-12-28 DIAGNOSIS — Z01818 Encounter for other preprocedural examination: Secondary | ICD-10-CM

## 2019-12-28 MED ORDER — CLENPIQ 10-3.5-12 MG-GM -GM/160ML PO SOLN: 1.0000 | Freq: Once | ORAL | 0 refills | Status: AC

## 2019-12-28 NOTE — Patient Instructions (Signed)
You have been scheduled for an endoscopy and colonoscopy. Please follow the written instructions given to you at your visit today. Please pick up your prep supplies at the pharmacy within the next 1-3 days. If you use inhalers (even only as needed), please bring them with you on the day of your procedure. Your physician has requested that you go to www.startemmi.com and enter the access code given to you at your visit today. This web site gives a general overview about your procedure. However, you should still follow specific instructions given to you by our office regarding your preparation for the procedure.  Your provider has requested that you go to the basement level for lab work at [facility] in [place] [address]. Press "B" on the elevator. The lab is located at the first door on the left as you exit the elevator.  It was a pleasure to see you today!  Vito Cirigliano, D.O.

## 2019-12-28 NOTE — Progress Notes (Signed)
Chief Complaint: Fecal urgency, lower abdominal pain, hematochezia  Referring Provider:     Ronnald Nian, DO   HPI:    Rodney Cherry. is a 39 y.o. male with a history of migraines, asthma, seizure disorder, depression, GERD, obesity (BMI 38.99), referred to the Gastroenterology Clinic for evaluation of multiple GI symptoms to include fecal urgency, loose stools, lower abdominal pain/cramping, and hematochezia.  States he has been having loose stools for the last few years, with postprandial fecal urgency.  Symptoms occur independent of food types.  Will have associated lower abdominal cramping which typically resolves after BM.  Has up to 4-6 BM/day.  Can have nocturnal abdominal pain with stools 1-2 nights/week. Occasional mucus like stools. Sxs worsening lately. Has trialed BRAT diet, food diary, elimination style diets, dairy free, etc, all without change (wife has Celiac, so does not typically consume gluten anyway).   Does have associated intermittent small-volume hematochezia that he is attributed to hemorrhoids. Sxs x2-3 years.   No n/v/f/c.   Appendectomy in 2017 for appendicitis with spontaneous rupture. Thinks sxs increased after that surgery (but were present prior to surgery).   Recently referred to medical weight loss clinic.  -Labs from 12/17/2019: Normal CMP, TSH, T4. -Normal CBC in 07/2018. -Normal HIDA 02/2015  Endoscopic history: -Flexible sigmoidoscopy approximately 5 years ago at outside facility.  Unsure of results - Possible EGD approx 5 years ago as well at outside facility with results unknown  Past Medical History:  Diagnosis Date  . Allergy   . Asthma   . Depression   . GERD (gastroesophageal reflux disease)   . Migraine   . Mild intermittent asthma 01/29/2018  . Seizures ([place])      Past Surgical History:  Procedure Laterality Date  . APPENDECTOMY    . CHOLECYSTECTOMY    . ESOPHAGOGASTRODUODENOSCOPY  01/24/2015   Dr. Glennon Hamilton.  [facility]  . FLEXIBLE SIGMOIDOSCOPY  01/24/2015   Dr Glennon Hamilton. [facility]  . NASAL SEPTUM SURGERY    . VASECTOMY    . WISDOM TOOTH EXTRACTION     Family History  Problem Relation Age of Onset  . Arthritis Mother   . Diabetes Mother   . Hearing loss Mother   . Hyperlipidemia Mother   . Hypertension Mother   . Kidney disease Mother   . Alcohol abuse Father   . Arthritis Father   . Asthma Father   . Cancer Father   . Depression Father   . Drug abuse Father   . Hearing loss Father   . Heart attack Father   . Heart disease Father   . Hyperlipidemia Father   . Hypertension Father   . Kidney disease Father   . Stroke Father   . Stroke Sister   . Depression Sister   . Diabetes Sister   . Hearing loss Sister   . Kidney disease Sister   . Learning disabilities Sister   . Mental illness Sister   . Asthma Daughter   . Arthritis Maternal Grandmother   . Cancer Maternal Grandmother   . Diabetes Maternal Grandmother   . Hypertension Maternal Grandmother   . Kidney disease Maternal Grandmother   . Stroke Maternal Grandmother   . Arthritis Maternal Grandfather   . Cancer Maternal Grandfather   . Heart attack Maternal Grandfather   . Arthritis Paternal Grandmother   . Alcohol abuse Paternal Grandmother   . Cancer Paternal Grandmother   .  COPD Paternal Grandmother   . Diabetes Paternal Grandmother   . Heart attack Paternal Grandmother   . Hearing loss Paternal Grandmother   . Hypertension Paternal Grandmother   . Arthritis Paternal Grandfather   . Alcohol abuse Paternal Grandfather   . Cancer Paternal Grandfather   . COPD Paternal Grandfather   . Diabetes Paternal Grandfather   . Heart attack Paternal Grandfather   . Heart disease Paternal Grandfather   . Hearing loss Paternal Grandfather   . Hypertension Paternal Grandfather   . Stroke Paternal Grandfather   . Colon cancer Paternal Grandfather   . Birth defects Sister   . Esophageal cancer Neg Hx    Social History     Tobacco Use  . Smoking status: Former Research scientist (life sciences)  . Smokeless tobacco: Never Used  Substance Use Topics  . Alcohol use: Yes    Comment: rare  . Drug use: Never   Current Outpatient Medications  Medication Sig Dispense Refill  . albuterol (PROVENTIL HFA;VENTOLIN HFA) 108 (90 Base) MCG/ACT inhaler Inhale 2 puffs into the lungs every 6 (six) hours as needed for wheezing or shortness of breath. 1 Inhaler 1  . fluticasone furoate-vilanterol (BREO ELLIPTA) 100-25 MCG/INH AEPB Inhale 1 puff into the lungs daily. (Patient taking differently: Inhale 1 puff into the lungs daily as needed (Does every other day). ) 1 each 6  . Galcanezumab-gnlm (EMGALITY) 120 MG/ML SOAJ INJECT 1 ML INTO THE SKIN EVERY 30 (THIRTY) DAYS.    Marland Kitchen LATUDA 40 MG TABS tablet Take 1 tablet (40 mg total) by mouth at bedtime. 90 tablet 3  . NURTEC 75 MG TBDP 1 tablet as needed.     . pantoprazole (PROTONIX) 40 MG tablet Take 1 tablet (40 mg total) by mouth daily. 30 tablet 3  . promethazine (PHENERGAN) 25 MG tablet Take 25 mg by mouth every 6 (six) hours as needed for nausea or vomiting.    . traZODone (DESYREL) 50 MG tablet Take 50 mg by mouth at bedtime as needed. for sleep     No current facility-administered medications for this visit.   Allergies  Allergen Reactions  . 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 Escitalopram Oxalate Rash  . Hydrocodone Other (See Comments)    Rebound headaches  . Latex Rash  . Morphine Other (See Comments)    Severe headaches Severe migraine   . Mushroom Extract Complex Other (See Comments)  . Other Other (See Comments)    All codeine medication increase migraine symptoms  . Sulfa Antibiotics Other (See Comments)    Causes glaucoma  VISION LOSS      Review of Systems: All systems reviewed and negative except where noted in HPI.     Physical Exam:    Wt Readings from Last 3 Encounters:  12/28/19 295 lb 8 oz (134  kg)  12/17/19 293 lb (132.9 kg)  06/17/19 285 lb 4.4 oz (129.4 kg)    BP 114/76   Pulse (!) 111   Temp 98.9 F (37.2 C)   Ht 6\' 1"  (1.854 m)   Wt 295 lb 8 oz (134 kg)   BMI 38.99 kg/m  Constitutional:  Pleasant, in no acute distress. Psychiatric: Normal mood and affect. Behavior is normal. EENT: Pupils normal.  Conjunctivae are normal. No scleral icterus. Neck supple. No cervical LAD. Cardiovascular: Normal rate, regular rhythm. No edema Pulmonary/chest: Effort normal and breath sounds normal. No wheezing, rales or rhonchi. Abdominal: Mild  abdominal discomfort bilateral lower abdomen without rebound or guarding.  No peritoneal signs.  Soft, nondistended.  No flank pain.  Bowel sounds active throughout. There are no masses palpable. No hepatomegaly. Neurological: Alert and oriented to person place and time. Skin: Skin is warm and dry. No rashes noted. Rectal: Exam deferred by patient to time of colonoscopy.    ASSESSMENT AND PLAN;   1) Diarrhea 2) Change in bowel habits 3) Lower abdominal cramping 4) Fecal urgency 5) Hematochezia  - Check CBC, Celiac panel - Check GI PCR panel -CMP, TSH, T4 otherwise normal recently - EGD with duodenal bxs to evaluate for Celiac disease, malabsorptive disorder, etc. - Colonoscopy with random and directed bxs to eval for MC, IBD, etc -If clinically significant hemorrhoids, can consider hemorrhoid band ligation  The indications, risks, and benefits of EGD and colonoscopy were explained to the patient in detail. Risks include but are not limited to bleeding, perforation, adverse reaction to medications, and cardiopulmonary compromise. Sequelae include but are not limited to the possibility of surgery, hositalization, and mortality. The patient verbalized understanding and wished to proceed. All questions answered, referred to scheduler and bowel prep ordered. Further recommendations pending results of the exam.    Lavena Bullion, DO, FACG   12/28/2019, 2:46 PM   Darling Cieslewicz, Garvin Fila, DO

## 2019-12-29 DIAGNOSIS — F4310 Post-traumatic stress disorder, unspecified: Secondary | ICD-10-CM | POA: Diagnosis not present

## 2019-12-31 ENCOUNTER — Other Ambulatory Visit (INDEPENDENT_AMBULATORY_CARE_PROVIDER_SITE_OTHER): Payer: BC Managed Care – PPO

## 2019-12-31 ENCOUNTER — Other Ambulatory Visit: Payer: Self-pay

## 2019-12-31 DIAGNOSIS — R109 Unspecified abdominal pain: Secondary | ICD-10-CM

## 2019-12-31 DIAGNOSIS — R194 Change in bowel habit: Secondary | ICD-10-CM | POA: Diagnosis not present

## 2019-12-31 DIAGNOSIS — R7301 Impaired fasting glucose: Secondary | ICD-10-CM

## 2019-12-31 DIAGNOSIS — Z01818 Encounter for other preprocedural examination: Secondary | ICD-10-CM

## 2019-12-31 DIAGNOSIS — R197 Diarrhea, unspecified: Secondary | ICD-10-CM

## 2019-12-31 DIAGNOSIS — K921 Melena: Secondary | ICD-10-CM

## 2019-12-31 LAB — CBC WITH DIFFERENTIAL/PLATELET
Basophils Absolute: 0.1 10*3/uL (ref 0.0–0.1)
Basophils Relative: 0.8 % (ref 0.0–3.0)
Eosinophils Absolute: 0.2 10*3/uL (ref 0.0–0.7)
Eosinophils Relative: 1.9 % (ref 0.0–5.0)
HCT: 46 % (ref 39.0–52.0)
Hemoglobin: 15.4 g/dL (ref 13.0–17.0)
Lymphocytes Relative: 26 % (ref 12.0–46.0)
Lymphs Abs: 2.1 10*3/uL (ref 0.7–4.0)
MCHC: 33.6 g/dL (ref 30.0–36.0)
MCV: 82.3 fl (ref 78.0–100.0)
Monocytes Absolute: 0.3 10*3/uL (ref 0.1–1.0)
Monocytes Relative: 4.2 % (ref 3.0–12.0)
Neutro Abs: 5.5 10*3/uL (ref 1.4–7.7)
Neutrophils Relative %: 67.1 % (ref 43.0–77.0)
Platelets: 253 10*3/uL (ref 150.0–400.0)
RBC: 5.59 Mil/uL (ref 4.22–5.81)
RDW: 14.1 % (ref 11.5–15.5)
WBC: 8.2 10*3/uL (ref 4.0–10.5)

## 2019-12-31 LAB — HEMOGLOBIN A1C: Hgb A1c MFr Bld: 6.3 % (ref 4.6–6.5)

## 2019-12-31 NOTE — Addendum Note (Signed)
Addended by: Lynnea Ferrier on: 12/31/2019 01:19 PM   Modules accepted: Orders

## 2019-12-31 NOTE — Addendum Note (Signed)
Addended by: Lynnea Ferrier on: 12/31/2019 11:22 AM   Modules accepted: Orders

## 2020-01-03 ENCOUNTER — Other Ambulatory Visit (INDEPENDENT_AMBULATORY_CARE_PROVIDER_SITE_OTHER): Payer: BC Managed Care – PPO

## 2020-01-03 ENCOUNTER — Other Ambulatory Visit: Payer: Self-pay

## 2020-01-03 DIAGNOSIS — R109 Unspecified abdominal pain: Secondary | ICD-10-CM | POA: Diagnosis not present

## 2020-01-03 DIAGNOSIS — R197 Diarrhea, unspecified: Secondary | ICD-10-CM

## 2020-01-03 DIAGNOSIS — Z01818 Encounter for other preprocedural examination: Secondary | ICD-10-CM

## 2020-01-03 DIAGNOSIS — R194 Change in bowel habit: Secondary | ICD-10-CM | POA: Diagnosis not present

## 2020-01-03 DIAGNOSIS — K921 Melena: Secondary | ICD-10-CM

## 2020-01-03 LAB — IGA: Immunoglobulin A: 351 mg/dL — ABNORMAL HIGH (ref 47–310)

## 2020-01-03 LAB — TISSUE TRANSGLUTAMINASE, IGA: (tTG) Ab, IgA: 1 U/mL

## 2020-01-05 ENCOUNTER — Encounter: Payer: Self-pay | Admitting: Family Medicine

## 2020-01-05 DIAGNOSIS — F4310 Post-traumatic stress disorder, unspecified: Secondary | ICD-10-CM | POA: Diagnosis not present

## 2020-01-06 LAB — GASTROINTESTINAL PATHOGEN PANEL PCR
C. difficile Tox A/B, PCR: NOT DETECTED
Campylobacter, PCR: NOT DETECTED
Cryptosporidium, PCR: NOT DETECTED
E coli (ETEC) LT/ST PCR: NOT DETECTED
E coli (STEC) stx1/stx2, PCR: NOT DETECTED
E coli 0157, PCR: NOT DETECTED
Giardia lamblia, PCR: NOT DETECTED
Norovirus, PCR: NOT DETECTED
Rotavirus A, PCR: NOT DETECTED
Salmonella, PCR: UNDETERMINED — AB
Shigella, PCR: NOT DETECTED

## 2020-01-12 DIAGNOSIS — F332 Major depressive disorder, recurrent severe without psychotic features: Secondary | ICD-10-CM | POA: Diagnosis not present

## 2020-01-12 DIAGNOSIS — F4310 Post-traumatic stress disorder, unspecified: Secondary | ICD-10-CM | POA: Diagnosis not present

## 2020-01-13 ENCOUNTER — Other Ambulatory Visit: Payer: Self-pay | Admitting: Family Medicine

## 2020-01-19 DIAGNOSIS — F332 Major depressive disorder, recurrent severe without psychotic features: Secondary | ICD-10-CM | POA: Diagnosis not present

## 2020-01-19 DIAGNOSIS — F4310 Post-traumatic stress disorder, unspecified: Secondary | ICD-10-CM | POA: Diagnosis not present

## 2020-01-20 DIAGNOSIS — G43709 Chronic migraine without aura, not intractable, without status migrainosus: Secondary | ICD-10-CM | POA: Diagnosis not present

## 2020-01-26 DIAGNOSIS — F4310 Post-traumatic stress disorder, unspecified: Secondary | ICD-10-CM | POA: Diagnosis not present

## 2020-01-26 DIAGNOSIS — F332 Major depressive disorder, recurrent severe without psychotic features: Secondary | ICD-10-CM | POA: Diagnosis not present

## 2020-02-01 ENCOUNTER — Encounter: Payer: Self-pay | Admitting: Family Medicine

## 2020-02-02 ENCOUNTER — Other Ambulatory Visit: Payer: Self-pay

## 2020-02-02 DIAGNOSIS — F332 Major depressive disorder, recurrent severe without psychotic features: Secondary | ICD-10-CM | POA: Diagnosis not present

## 2020-02-02 DIAGNOSIS — F4310 Post-traumatic stress disorder, unspecified: Secondary | ICD-10-CM | POA: Diagnosis not present

## 2020-02-02 MED ORDER — ALBUTEROL SULFATE HFA 108 (90 BASE) MCG/ACT IN AERS: 2.0000 | INHALATION_SPRAY | Freq: Four times a day (QID) | RESPIRATORY_TRACT | 2 refills | Status: AC | PRN

## 2020-02-07 ENCOUNTER — Other Ambulatory Visit: Payer: Self-pay

## 2020-02-07 ENCOUNTER — Ambulatory Visit (INDEPENDENT_AMBULATORY_CARE_PROVIDER_SITE_OTHER): Payer: BC Managed Care – PPO | Admitting: Family Medicine

## 2020-02-07 ENCOUNTER — Other Ambulatory Visit: Payer: Self-pay | Admitting: Gastroenterology

## 2020-02-07 ENCOUNTER — Ambulatory Visit (INDEPENDENT_AMBULATORY_CARE_PROVIDER_SITE_OTHER): Payer: BC Managed Care – PPO

## 2020-02-07 ENCOUNTER — Encounter (INDEPENDENT_AMBULATORY_CARE_PROVIDER_SITE_OTHER): Payer: Self-pay | Admitting: Family Medicine

## 2020-02-07 VITALS — BP 136/81 | HR 90 | Temp 98.0°F | Ht 73.0 in | Wt 296.0 lb

## 2020-02-07 DIAGNOSIS — F319 Bipolar disorder, unspecified: Secondary | ICD-10-CM

## 2020-02-07 DIAGNOSIS — Z9189 Other specified personal risk factors, not elsewhere classified: Secondary | ICD-10-CM | POA: Diagnosis not present

## 2020-02-07 DIAGNOSIS — G43809 Other migraine, not intractable, without status migrainosus: Secondary | ICD-10-CM | POA: Diagnosis not present

## 2020-02-07 DIAGNOSIS — R7303 Prediabetes: Secondary | ICD-10-CM

## 2020-02-07 DIAGNOSIS — R5383 Other fatigue: Secondary | ICD-10-CM

## 2020-02-07 DIAGNOSIS — Z1159 Encounter for screening for other viral diseases: Secondary | ICD-10-CM | POA: Diagnosis not present

## 2020-02-07 DIAGNOSIS — Z0289 Encounter for other administrative examinations: Secondary | ICD-10-CM

## 2020-02-07 DIAGNOSIS — Z1331 Encounter for screening for depression: Secondary | ICD-10-CM | POA: Diagnosis not present

## 2020-02-07 DIAGNOSIS — Z6839 Body mass index (BMI) 39.0-39.9, adult: Secondary | ICD-10-CM

## 2020-02-07 DIAGNOSIS — R0602 Shortness of breath: Secondary | ICD-10-CM | POA: Diagnosis not present

## 2020-02-07 LAB — SARS CORONAVIRUS 2 (TAT 6-24 HRS): SARS Coronavirus 2: NEGATIVE

## 2020-02-07 NOTE — Progress Notes (Signed)
Dear Dr. Bryan Lemma,   Thank you for referring Rodney Cherry. to our clinic. The following note includes my evaluation and treatment recommendations.  Chief Complaint:   OBESITY Rodney Cherry. (MR# [id number]) is a 39 y.o. male who presents for evaluation and treatment of obesity and related comorbidities. Current BMI is Body mass index is 39.05 kg/m. [name] has been struggling with his weight for many years and has been unsuccessful in either losing weight, maintaining weight loss, or reaching his healthy weight goal.  Rodney Cherry is currently in the action stage of change and ready to dedicate time achieving and maintaining a healthier weight. Rodney Cherry is interested in becoming our patient and working on intensive lifestyle modifications including (but not limited to) diet and exercise for weight loss.  Rodney Cherry will have coffee with 2-3 ounces of creamer in the morning and a cheese stick at 10 am (not hungry).  At 11 am to 12 noon, he will lunch that consists of 1.5 eggs, baked spinach (1.5 ounces) and ricotta (1 ounce) with 1 piece of sourdough with butter (neither satisfied or full).  At around 2 pm, he will have a cheese stick or a [employer] Sweet and Salty bar.  Dinner will be 4 ounces of chicken or pork with 2 cups salad with 1 cup broccoli rice (feels satisfied).  After dinner, he will have peanut butter and honey with crackers or biscuits.   Rodney Cherry's habits were reviewed today and are as follows: His family eats meals together, he doesn't know if his family will eat healthier with him, his desired weight loss is 83 pounds, he started gaining weight within the last 10 years, his heaviest weight ever was 325 pounds, he craves [place] food, he skips breakfast daily, he is frequently drinking liquids with calories, he frequently makes poor food choices, he has problems with excessive hunger, he frequently eats larger portions than normal and he struggles with emotional eating.  Depression  Screen Rodney Cherry's Food and Mood (modified PHQ-9) score was 14.  Depression screen PHQ 2/9 02/07/2020  Decreased Interest 1  Down, Depressed, Hopeless 2  PHQ - 2 Score 3  Altered sleeping 3  Tired, decreased energy 3  Change in appetite 3  Feeling bad or failure about yourself  1  Trouble concentrating 1  Moving slowly or fidgety/restless 0  Suicidal thoughts 0  PHQ-9 Score 14  Difficult doing work/chores Somewhat difficult   Subjective:   1. Other fatigue Lonzo admits to daytime somnolence and reports waking up still tired. Patent has a history of symptoms of daytime fatigue, morning fatigue and snoring. Rodney Cherry generally gets 10 hours of sleep per night, and states that he has poor quality sleep. Snoring is present. Apneic episodes are present. Epworth Sleepiness Score is 6.  2. Shortness of breath on exertion Rodney Cherry notes increasing shortness of breath with exercising and seems to be worsening over time with weight gain. He notes getting out of breath sooner with activity than he used to. This has gotten worse recently. Conlee denies shortness of breath at rest or orthopnea.  3. Prediabetes Rodney Cherry has a new diagnosis of prediabetes based on his elevated HgA1c and was informed this puts him at greater risk of developing diabetes. He continues to work on diet and exercise to decrease his risk of diabetes. He denies nausea or hypoglycemia.  He is not on any medication.    Lab Results  Component Value Date   HGBA1C 6.3 12/31/2019  4. Other migraine without status migrainosus, not intractable Rodney Cherry has been having a migraine 3 times a week.  He has been seeing [facility].  5. Bipolar affective disorder, remission status unspecified ([place]) Rodney Cherry is taking [place].  He feels as though his symptoms are better controlled.  6. Depression screening Rodney Cherry was screened for depression as part of his new patient workup today.  7. At risk for diabetes mellitus Rodney Cherry was given approximately 15  minutes of diabetes education and counseling today. We discussed intensive lifestyle modifications today with an emphasis on weight loss as well as increasing exercise and decreasing simple carbohydrates in his diet. We also reviewed medication options with an emphasis on risk versus benefit of those discussed.   Repetitive spaced learning was employed today to elicit superior memory formation and behavioral change.  Assessment/Plan:   1. Other fatigue Rodney Cherry does feel that his weight is causing his energy to be lower than it should be. Fatigue may be related to obesity, depression or many other causes. Labs will be ordered, and in the meanwhile, Rodney Cherry will focus on self care including making healthy food choices, increasing physical activity and focusing on stress reduction. - EKG 12-Lead - VITAMIN D 25 Hydroxy (Vit-D Deficiency, Fractures)  2. Shortness of breath on exertion Rodney Cherry does feel that he gets out of breath more easily that he used to when he exercises. Rodney Cherry's shortness of breath appears to be obesity related and exercise induced. He has agreed to work on weight loss and gradually increase exercise to treat his exercise induced shortness of breath. Will continue to monitor closely.  3. Prediabetes Rodney Cherry will continue to work on weight loss, exercise, and decreasing simple carbohydrates to help decrease the risk of diabetes.  - Insulin, random  4. Other migraine without status migrainosus, not intractable Follow-up with [facility] for further management.  5. Bipolar affective disorder, remission status unspecified ([place]) Follow-up with Dr. Bryan Lemma (PCP).  6. Depression screening Rodney Cherry had a positive depression screening. Depression is commonly associated with obesity and often results in emotional eating behaviors. We will monitor this closely and work on CBT to help improve the non-hunger eating patterns. Referral to Psychology may be required if no improvement is seen as  he continues in our clinic.  7. At risk for diabetes mellitus Rodney Cherry was given approximately 15 minutes of diabetes education and counseling today. We discussed intensive lifestyle modifications today with an emphasis on weight loss as well as increasing exercise and decreasing simple carbohydrates in his diet. We also reviewed medication options with an emphasis on risk versus benefit of those discussed.   Repetitive spaced learning was employed today to elicit superior memory formation and behavioral change.  8. Class 2 severe obesity with serious comorbidity and body mass index (BMI) of 39.0 to 39.9 in adult, unspecified obesity type (HCC) Witold is currently in the action stage of change and his goal is to continue with weight loss efforts. I recommend Chamar begin the structured treatment plan as follows:  He has agreed to the Category 4 Plan.  Exercise goals: No exercise has been prescribed at this time.   Behavioral modification strategies: increasing lean protein intake, increasing water intake, meal planning and cooking strategies, keeping healthy foods in the home and planning for success.  He was informed of the importance of frequent follow-up visits to maximize his success with intensive lifestyle modifications for his multiple health conditions. He was informed we would discuss his lab results at his next visit  unless there is a critical issue that needs to be addressed sooner. Keena agreed to keep his next visit at the agreed upon time to discuss these results.  Objective:   Blood pressure 136/81, pulse 90, temperature 98 F (36.7 C), temperature source Oral, height 6\' 1"  (1.854 m), weight 296 lb (134.3 kg), SpO2 97 %. Body mass index is 39.05 kg/m.  EKG: Normal sinus rhythm, rate 90 bpm.  Indirect Calorimeter completed today shows a VO2 of 348 and a REE of 2426.  His calculated basal metabolic rate is 99991111 thus his basal metabolic rate is better than expected.  General:  Cooperative, alert, well developed, in no acute distress. HEENT: Conjunctivae and lids unremarkable. Cardiovascular: Regular rhythm.  Lungs: Normal work of breathing. Neurologic: No focal deficits.   Lab Results  Component Value Date   CREATININE 1.03 12/17/2019   BUN 11 12/17/2019   NA 135 12/17/2019   K 4.3 12/17/2019   CL 102 12/17/2019   CO2 25 12/17/2019   Lab Results  Component Value Date   ALT 35 12/17/2019   AST 27 12/17/2019   ALKPHOS 72 12/17/2019   BILITOT 0.7 12/17/2019   Lab Results  Component Value Date   HGBA1C 6.3 12/31/2019   Lab Results  Component Value Date   TSH 1.30 12/17/2019   Lab Results  Component Value Date   CHOL 202 (H) 12/17/2019   HDL 43.10 12/17/2019   LDLDIRECT 151.0 12/17/2019   TRIG 208.0 (H) 12/17/2019   CHOLHDL 5 12/17/2019   Lab Results  Component Value Date   WBC 8.2 12/31/2019   HGB 15.4 12/31/2019   HCT 46.0 12/31/2019   MCV 82.3 12/31/2019   PLT 253.0 12/31/2019   Attestation Statements:   Reviewed by clinician on day of visit: allergies, medications, problem list, medical history, surgical history, family history, social history, and previous encounter notes.  I, Water quality scientist, CMA, am acting as transcriptionist for Coralie Common, MD.  I have reviewed the above documentation for accuracy and completeness, and I agree with the above. - Jinny Blossom, MD

## 2020-02-08 LAB — VITAMIN D 25 HYDROXY (VIT D DEFICIENCY, FRACTURES): Vit D, 25-Hydroxy: 9.9 ng/mL — ABNORMAL LOW (ref 30.0–100.0)

## 2020-02-08 LAB — INSULIN, RANDOM: INSULIN: 51.4 u[IU]/mL — ABNORMAL HIGH (ref 2.6–24.9)

## 2020-02-09 DIAGNOSIS — F332 Major depressive disorder, recurrent severe without psychotic features: Secondary | ICD-10-CM | POA: Diagnosis not present

## 2020-02-09 DIAGNOSIS — F4310 Post-traumatic stress disorder, unspecified: Secondary | ICD-10-CM | POA: Diagnosis not present

## 2020-02-10 ENCOUNTER — Other Ambulatory Visit: Payer: Self-pay

## 2020-02-10 ENCOUNTER — Ambulatory Visit (AMBULATORY_SURGERY_CENTER): Payer: BC Managed Care – PPO | Admitting: Gastroenterology

## 2020-02-10 ENCOUNTER — Encounter: Payer: Self-pay | Admitting: Gastroenterology

## 2020-02-10 VITALS — BP 106/70 | HR 69 | Temp 97.1°F | Resp 17 | Ht 73.0 in | Wt 296.0 lb

## 2020-02-10 DIAGNOSIS — D125 Benign neoplasm of sigmoid colon: Secondary | ICD-10-CM

## 2020-02-10 DIAGNOSIS — R197 Diarrhea, unspecified: Secondary | ICD-10-CM | POA: Diagnosis not present

## 2020-02-10 DIAGNOSIS — K2950 Unspecified chronic gastritis without bleeding: Secondary | ICD-10-CM | POA: Diagnosis not present

## 2020-02-10 DIAGNOSIS — R109 Unspecified abdominal pain: Secondary | ICD-10-CM

## 2020-02-10 DIAGNOSIS — Z1211 Encounter for screening for malignant neoplasm of colon: Secondary | ICD-10-CM | POA: Diagnosis not present

## 2020-02-10 DIAGNOSIS — D124 Benign neoplasm of descending colon: Secondary | ICD-10-CM

## 2020-02-10 DIAGNOSIS — K2970 Gastritis, unspecified, without bleeding: Secondary | ICD-10-CM

## 2020-02-10 DIAGNOSIS — K641 Second degree hemorrhoids: Secondary | ICD-10-CM

## 2020-02-10 DIAGNOSIS — K209 Esophagitis, unspecified without bleeding: Secondary | ICD-10-CM

## 2020-02-10 DIAGNOSIS — K2100 Gastro-esophageal reflux disease with esophagitis, without bleeding: Secondary | ICD-10-CM | POA: Diagnosis not present

## 2020-02-10 MED ORDER — SODIUM CHLORIDE 0.9 % IV SOLN: 500.0000 mL | INTRAVENOUS | Status: DC

## 2020-02-10 NOTE — Patient Instructions (Signed)
Be sure to take your Protonix 1/2 hour before breakfast every day as ordered.  Read all of the handouts given to you by your recovery room nurse.  YOU HAD AN ENDOSCOPIC PROCEDURE TODAY AT [facility]:   Refer to the procedure report that was given to you for any specific questions about what was found during the examination.  If the procedure report does not answer your questions, please call your gastroenterologist to clarify.  If you requested that your care partner not be given the details of your procedure findings, then the procedure report has been included in a sealed envelope for you to review at your convenience later.  YOU SHOULD EXPECT: Some feelings of bloating in the abdomen. Passage of more gas than usual.  Walking can help get rid of the air that was put into your GI tract during the procedure and reduce the bloating. If you had a lower endoscopy (such as a colonoscopy or flexible sigmoidoscopy) you may notice spotting of blood in your stool or on the toilet paper. If you underwent a bowel prep for your procedure, you may not have a normal bowel movement for a few days.  Please Note:  You might notice some irritation and congestion in your nose or some drainage.  This is from the oxygen used during your procedure.  There is no need for concern and it should clear up in a day or so.  SYMPTOMS TO REPORT IMMEDIATELY:   Following lower endoscopy (colonoscopy or flexible sigmoidoscopy):  Excessive amounts of blood in the stool  Significant tenderness or worsening of abdominal pains  Swelling of the abdomen that is new, acute  Fever of 100F or higher   Following upper endoscopy (EGD)  Vomiting of blood or coffee ground material  New chest pain or pain under the shoulder blades  Painful or persistently difficult swallowing  New shortness of breath  Fever of 100F or higher  Black, tarry-looking stools  For urgent or emergent issues, a gastroenterologist can be  reached at any hour by calling [phone]. Do not use MyChart messaging for urgent concerns.    DIET:  We do recommend a small meal at first, but then you may proceed to your regular diet.  Drink plenty of fluids but you should avoid alcoholic beverages for 24 hours. Try to eat more fiber in your diet, and drink plenty of water.  Citrucel, Fibercon Metamucil or Benefiber are some of the more common fiber supplements.  ACTIVITY:  You should plan to take it easy for the rest of today and you should NOT DRIVE or use heavy machinery until tomorrow (because of the sedation medicines used during the test).    FOLLOW UP: Our staff will call the number listed on your records 48-72 hours following your procedure to check on you and address any questions or concerns that you may have regarding the information given to you following your procedure. If we do not reach you, we will leave a message.  We will attempt to reach you two times.  During this call, we will ask if you have developed any symptoms of COVID 19. If you develop any symptoms (ie: fever, flu-like symptoms, shortness of breath, cough etc.) before then, please call [phone].  If you test positive for Covid 19 in the 2 weeks post procedure, please call and report this information to [place].    If any biopsies were taken you will be contacted by phone or by letter within  the next 1-3 weeks.  Please call us at [phone] if you have not heard about the biopsies in 3 weeks.    SIGNATURES/CONFIDENTIALITY: You and/or your care partner have signed paperwork which will be entered into your electronic medical record.  These signatures attest to the fact that that the information above on your After Visit Summary has been reviewed and is understood.  Full responsibility of the confidentiality of this discharge information lies with you and/or your care-partner.

## 2020-02-10 NOTE — Op Note (Signed)
[facility] Patient Name: Rodney Cherry Procedure Date: 02/10/2020 7:23 AM MRN: [id number] Endoscopist: Gerrit Heck , MD Age: 39 Referring MD:  Date of Birth: Mar 21, 1981 Gender: Male Account #: [id number] Procedure:                Colonoscopy Indications:              Lower abdominal pain, Hematochezia, Change in bowel                            habits, Diarrhea, Fecal urgency Medicines:                Monitored Anesthesia Care Procedure:                Pre-Anesthesia Assessment:                           - Prior to the procedure, a History and Physical                            was performed, and patient medications and                            allergies were reviewed. The patient's tolerance of                            previous anesthesia was also reviewed. The risks                            and benefits of the procedure and the sedation                            options and risks were discussed with the patient.                            All questions were answered, and informed consent                            was obtained. Prior Anticoagulants: The patient has                            taken no previous anticoagulant or antiplatelet                            agents. ASA Grade Assessment: II - A patient with                            mild systemic disease. After reviewing the risks                            and benefits, the patient was deemed in                            satisfactory condition to undergo the procedure.  After obtaining informed consent, the colonoscope                            was passed under direct vision. Throughout the                            procedure, the patient's blood pressure, pulse, and                            oxygen saturations were monitored continuously. The                            Colonoscope was introduced through the anus and                            advanced to the the terminal  ileum. The colonoscopy                            was performed without difficulty. The patient                            tolerated the procedure well. The quality of the                            bowel preparation was excellent. The terminal                            ileum, ileocecal valve, appendiceal orifice, and                            rectum were photographed. Scope In: 8:13:26 AM Scope Out: 8:29:43 AM Scope Withdrawal Time: 0 hours 14 minutes 42 seconds  Total Procedure Duration: 0 hours 16 minutes 17 seconds  Findings:                 Hemorrhoids were found on perianal exam.                           Four sessile polyps were found in the sigmoid colon                            (2) and descending colon (2). The polyps were 2 to                            3 mm in size. These polyps were removed with a cold                            biopsy forceps. Resection and retrieval were                            complete. Estimated blood loss was minimal.                           Normal mucosa was otherwise found in  the remainder                            of the entire colon. Biopsies for histology were                            taken with a cold forceps from the right colon and                            left colon for evaluation of microscopic colitis.                            Estimated blood loss was minimal.                           Non-bleeding internal hemorrhoids were found during                            retroflexion. The hemorrhoids were small.                           The terminal ileum appeared normal. Complications:            No immediate complications. Estimated Blood Loss:     Estimated blood loss was minimal. Impression:               - Hemorrhoids found on perianal exam.                           - Four 2 to 3 mm polyps in the sigmoid colon and in                            the descending colon, removed with a cold biopsy                            forceps.  Resected and retrieved.                           - Normal mucosa in the entire examined colon.                            Biopsied.                           - Non-bleeding internal hemorrhoids.                           - The examined portion of the ileum was normal. Recommendation:           - Patient has a contact number available for                            emergencies. The signs and symptoms of potential                            delayed complications were discussed  with the                            patient. Return to normal activities tomorrow.                            Written discharge instructions were provided to the                            patient.                           - Resume previous diet.                           - Continue present medications.                           - Await pathology results.                           - Repeat colonoscopy for surveillance based on                            pathology results.                           - Return to GI office at appointment to be                            scheduled.                           - Use fiber, for example Citrucel, Fibercon, Konsyl                            or Metamucil.                           - Internal hemorrhoids were noted on this study and                            may be amenable to hemorrhoid band ligation. If you                            are interested in further treatment of these                            hemorrhoids with band ligation, please contact my                            clinic to set up an appointment for evaluation and                            treatment. Gerrit Heck, MD 02/10/2020 8:41:22 AM

## 2020-02-10 NOTE — Progress Notes (Signed)
A and O x3. Report to RN. Tolerated MAC anesthesia well.Teeth unchanged after procedure.

## 2020-02-10 NOTE — Progress Notes (Signed)
Temp JB  v/s DT

## 2020-02-10 NOTE — Progress Notes (Signed)
Called to room to assist during endoscopic procedure.  Patient ID and intended procedure confirmed with present staff. Received instructions for my participation in the procedure from the performing physician.  

## 2020-02-10 NOTE — Op Note (Signed)
[facility] Patient Name: Rodney Cherry Procedure Date: 02/10/2020 7:26 AM MRN: [id number] Endoscopist: Gerrit Heck , MD Age: 39 Referring MD:  Date of Birth: 10-Feb-1981 Gender: Male Account #: [id number] Procedure:                Upper GI endoscopy Indications:              Lower abdominal pain, Esophageal reflux,                            Hematochezia, Diarrhea Medicines:                Monitored Anesthesia Care Procedure:                Pre-Anesthesia Assessment:                           - Prior to the procedure, a History and Physical                            was performed, and patient medications and                            allergies were reviewed. The patient's tolerance of                            previous anesthesia was also reviewed. The risks                            and benefits of the procedure and the sedation                            options and risks were discussed with the patient.                            All questions were answered, and informed consent                            was obtained. Prior Anticoagulants: The patient has                            taken no previous anticoagulant or antiplatelet                            agents. ASA Grade Assessment: II - A patient with                            mild systemic disease. After reviewing the risks                            and benefits, the patient was deemed in                            satisfactory condition to undergo the procedure.  After obtaining informed consent, the endoscope was                            passed under direct vision. Throughout the                            procedure, the patient's blood pressure, pulse, and                            oxygen saturations were monitored continuously. The                            Endoscope was introduced through the mouth, and                            advanced to the second part of duodenum. The  upper                            GI endoscopy was accomplished without difficulty.                            The patient tolerated the procedure well. Scope In: Scope Out: Findings:                 The upper third of the esophagus and middle third                            of the esophagus were normal.                           LA Grade A (one or more mucosal breaks less than 5                            mm, not extending between tops of 2 mucosal folds)                            esophagitis with no bleeding was found 41 cm from                            the incisors.                           The gastroesophageal flap valve was visualized                            endoscopically and classified as Hill Grade II                            (fold present, opens with respiration).                           Diffuse mild inflammation characterized by  congestion (edema) and erythema was found in the                            gastric fundus, in the gastric body, at the                            incisura and in the gastric antrum. Biopsies were                            taken with a cold forceps for Helicobacter pylori                            testing. Estimated blood loss was minimal.                           The ampulla, duodenal bulb, first portion of the                            duodenum and second portion of the duodenum were                            normal. Biopsies for histology were taken with a                            cold forceps for evaluation of celiac disease.                            Estimated blood loss was minimal. Complications:            No immediate complications. Estimated Blood Loss:     Estimated blood loss was minimal. Impression:               - Normal upper third of esophagus and middle third                            of esophagus.                           - LA Grade A reflux esophagitis with no bleeding.                            - Gastroesophageal flap valve classified as Hill                            Grade II (fold present, opens with respiration).                           - Gastritis. Biopsied.                           - Normal ampulla, duodenal bulb, first portion of                            the duodenum and second portion of the duodenum.  Biopsied. Recommendation:           - Patient has a contact number available for                            emergencies. The signs and symptoms of potential                            delayed complications were discussed with the                            patient. Return to normal activities tomorrow.                            Written discharge instructions were provided to the                            patient.                           - Resume previous diet.                           - Continue present medications.                           - Resume pantoprazole (Protonix).                           - Await pathology results.                           - Perform a colonoscopy today. Gerrit Heck, MD 02/10/2020 8:37:22 AM

## 2020-02-14 ENCOUNTER — Telehealth: Payer: Self-pay

## 2020-02-14 NOTE — Telephone Encounter (Signed)
  Follow up Call-  Call back number 02/10/2020  Post procedure Call Back phone  # [phone]  Permission to leave phone message Yes  Some recent data might be hidden     Patient questions:  Do you have a fever, pain , or abdominal swelling? No. Pain Score  0 *  Have you tolerated food without any problems? Yes.    Have you been able to return to your normal activities? Yes.    Do you have any questions about your discharge instructions: Diet   No. Medications  No. Follow up visit  No.  Do you have questions or concerns about your Care? No.  Actions: * If pain score is 4 or above: No action needed, pain <4.   1. Have you developed a fever since your procedure? No  2.   Have you had an respiratory symptoms (SOB or cough) since your procedure? No  3.   Have you tested positive for COVID 19 since your procedure No  4.   Have you had any family members/close contacts diagnosed with the COVID 19 since your procedure?  No   If yes to any of these questions please route to Joylene John, RN and Erenest Rasher, RN

## 2020-02-15 DIAGNOSIS — H1820 Unspecified corneal edema: Secondary | ICD-10-CM | POA: Diagnosis not present

## 2020-02-16 DIAGNOSIS — F332 Major depressive disorder, recurrent severe without psychotic features: Secondary | ICD-10-CM | POA: Diagnosis not present

## 2020-02-16 DIAGNOSIS — F4310 Post-traumatic stress disorder, unspecified: Secondary | ICD-10-CM | POA: Diagnosis not present

## 2020-02-18 ENCOUNTER — Encounter: Payer: Self-pay | Admitting: Gastroenterology

## 2020-02-21 ENCOUNTER — Other Ambulatory Visit: Payer: Self-pay

## 2020-02-21 ENCOUNTER — Encounter (INDEPENDENT_AMBULATORY_CARE_PROVIDER_SITE_OTHER): Payer: Self-pay | Admitting: Family Medicine

## 2020-02-21 ENCOUNTER — Ambulatory Visit (INDEPENDENT_AMBULATORY_CARE_PROVIDER_SITE_OTHER): Payer: BC Managed Care – PPO | Admitting: Family Medicine

## 2020-02-21 VITALS — BP 123/81 | HR 113 | Temp 98.4°F | Ht 73.0 in | Wt 300.0 lb

## 2020-02-21 DIAGNOSIS — Z9189 Other specified personal risk factors, not elsewhere classified: Secondary | ICD-10-CM | POA: Diagnosis not present

## 2020-02-21 DIAGNOSIS — E559 Vitamin D deficiency, unspecified: Secondary | ICD-10-CM

## 2020-02-21 DIAGNOSIS — R7303 Prediabetes: Secondary | ICD-10-CM | POA: Diagnosis not present

## 2020-02-21 DIAGNOSIS — E7849 Other hyperlipidemia: Secondary | ICD-10-CM | POA: Diagnosis not present

## 2020-02-21 DIAGNOSIS — Z6839 Body mass index (BMI) 39.0-39.9, adult: Secondary | ICD-10-CM

## 2020-02-21 MED ORDER — VITAMIN D (ERGOCALCIFEROL) 1.25 MG (50000 UNIT) PO CAPS: 50000.0000 [IU] | ORAL_CAPSULE | ORAL | 0 refills | Status: DC

## 2020-02-22 NOTE — Progress Notes (Signed)
Chief Complaint:   OBESITY [name] is here to discuss his progress with his obesity treatment plan along with follow-up of his obesity related diagnoses. Rodney Cherry is on the Category 4 Plan and states he is following his eating plan approximately 80% of the time. [name] states he is exercising for 0 minutes 0 times per week.  Today's visit was #: 2 Starting weight: 296 lbs Starting date: 02/07/2020 Today's weight: 300 lbs Today's date: 02/21/2020 Total lbs lost to date: 0 Total lbs lost since last in-office visit: 0  Interim History: Bo says he thinks the meal plan is a significant amount of food.  He made a breakfast casserole and is having 2 pieces of 45 calorie bread with unsalted butter and homemade blackberry jam.  Snack is a cheese stick.  Lunch consists of a sandwich or meals with yogurt and apples.  He will have beef jerky for a snack.  Dinner will be protein and vegetables.  He might have yogurt for a snack after dinner.  Subjective:   1. Vitamin D deficiency [name]'s Vitamin D level was 9.9 on 02/07/2020. He is currently taking OTC vitamin D each day. He denies nausea, vomiting or muscle weakness.  He endorses fatigue.  2. Prediabetes [name] has a diagnosis of prediabetes based on his elevated HgA1c and was informed this puts him at greater risk of developing diabetes. He continues to work on diet and exercise to decrease his risk of diabetes. He denies nausea or hypoglycemia.  He is not on medication.  No previous labs from the past 4 years.  He has had an increase in water weight from the first appointment to today.  Lab Results  Component Value Date   HGBA1C 6.3 12/31/2019   Lab Results  Component Value Date   INSULIN 51.4 (H) 02/07/2020   3. Other hyperlipidemia [name] has hyperlipidemia and has been trying to improve his cholesterol levels with intensive lifestyle modification including a low saturated fat diet, exercise and weight loss. He denies any chest pain,  claudication or myalgias.  He is not on a statin.  Cannot risk stratify due to age.  Lab Results  Component Value Date   ALT 35 12/17/2019   AST 27 12/17/2019   ALKPHOS 72 12/17/2019   BILITOT 0.7 12/17/2019   Lab Results  Component Value Date   CHOL 202 (H) 12/17/2019   HDL 43.10 12/17/2019   LDLDIRECT 151.0 12/17/2019   TRIG 208.0 (H) 12/17/2019   CHOLHDL 5 12/17/2019   4. At risk for osteoporosis Angie is at higher risk of osteopenia and osteoporosis due to Vitamin D deficiency.   Assessment/Plan:   1. Vitamin D deficiency Low Vitamin D level contributes to fatigue and are associated with obesity, breast, and colon cancer. He agrees to start to take prescription Vitamin D @50 ,000 IU every week and will follow-up for routine testing of Vitamin D, at least 2-3 times per year to avoid over-replacement. - Vitamin D, Ergocalciferol, (DRISDOL) 1.25 MG (50000 UNIT) CAPS capsule; Take 1 capsule (50,000 Units total) by mouth every 7 (seven) days.  Dispense: 4 capsule; Refill: 0  2. Prediabetes [name] will continue to work on weight loss, exercise, and decreasing simple carbohydrates to help decrease the risk of diabetes.  Will defer metformin.  If weight loss stalls or hunger increases, will reconsider initiation of metformin.  3. Other hyperlipidemia Cardiovascular risk and specific lipid/LDL goals reviewed.  We discussed several lifestyle modifications today and [name] will continue to work on  diet, exercise and weight loss efforts. Orders and follow up as documented in patient record.  Follow-up lipid panel in 3 months.  Counseling Intensive lifestyle modifications are the first line treatment for this issue. . Dietary changes: Increase soluble fiber. Decrease simple carbohydrates. . Exercise changes: Moderate to vigorous-intensity aerobic activity 150 minutes per week if tolerated. . Lipid-lowering medications: see documented in medical record.  4. At risk for osteoporosis [name]  was given approximately 15 minutes of osteoporosis prevention counseling today. [name] is at risk for osteopenia and osteoporosis due to his Vitamin D deficiency. He was encouraged to take his Vitamin D and follow his higher calcium diet and increase strengthening exercise to help strengthen his bones and decrease his risk of osteopenia and osteoporosis.  Repetitive spaced learning was employed today to elicit superior memory formation and behavioral change.  5. Class 2 severe obesity with serious comorbidity and body mass index (BMI) of 39.0 to 39.9 in adult, unspecified obesity type (HCC) [name] is currently in the action stage of change. As such, his goal is to continue with weight loss efforts. He has agreed to the Category 4 Plan.   Exercise goals: No exercise has been prescribed at this time.  Behavioral modification strategies: increasing lean protein intake, increasing vegetables, meal planning and cooking strategies, keeping healthy foods in the home and planning for success.  [name] has agreed to follow-up with our clinic in 2 weeks. He was informed of the importance of frequent follow-up visits to maximize his success with intensive lifestyle modifications for his multiple health conditions.   Objective:   Blood pressure 123/81, pulse (!) 113, temperature 98.4 F (36.9 C), temperature source Oral, height 6\' 1"  (1.854 m), weight 300 lb (136.1 kg), SpO2 95 %. Body mass index is 39.58 kg/m.  General: Cooperative, alert, well developed, in no acute distress. HEENT: Conjunctivae and lids unremarkable. Cardiovascular: Regular rhythm.  Lungs: Normal work of breathing. Neurologic: No focal deficits.   Lab Results  Component Value Date   CREATININE 1.03 12/17/2019   BUN 11 12/17/2019   NA 135 12/17/2019   K 4.3 12/17/2019   CL 102 12/17/2019   CO2 25 12/17/2019   Lab Results  Component Value Date   ALT 35 12/17/2019   AST 27 12/17/2019   ALKPHOS 72 12/17/2019   BILITOT 0.7  12/17/2019   Lab Results  Component Value Date   HGBA1C 6.3 12/31/2019   Lab Results  Component Value Date   INSULIN 51.4 (H) 02/07/2020   Lab Results  Component Value Date   TSH 1.30 12/17/2019   Lab Results  Component Value Date   CHOL 202 (H) 12/17/2019   HDL 43.10 12/17/2019   LDLDIRECT 151.0 12/17/2019   TRIG 208.0 (H) 12/17/2019   CHOLHDL 5 12/17/2019   Lab Results  Component Value Date   WBC 8.2 12/31/2019   HGB 15.4 12/31/2019   HCT 46.0 12/31/2019   MCV 82.3 12/31/2019   PLT 253.0 12/31/2019   Attestation Statements:   Reviewed by clinician on day of visit: allergies, medications, problem list, medical history, surgical history, family history, social history, and previous encounter notes.  I, Water quality scientist, CMA, am acting as transcriptionist for Coralie Common MD  I have reviewed the above documentation for accuracy and completeness, and I agree with the above. - Jinny Blossom, MD

## 2020-03-01 DIAGNOSIS — F4310 Post-traumatic stress disorder, unspecified: Secondary | ICD-10-CM | POA: Diagnosis not present

## 2020-03-01 DIAGNOSIS — F332 Major depressive disorder, recurrent severe without psychotic features: Secondary | ICD-10-CM | POA: Diagnosis not present

## 2020-03-09 ENCOUNTER — Encounter (INDEPENDENT_AMBULATORY_CARE_PROVIDER_SITE_OTHER): Payer: Self-pay | Admitting: Family Medicine

## 2020-03-09 ENCOUNTER — Ambulatory Visit (INDEPENDENT_AMBULATORY_CARE_PROVIDER_SITE_OTHER): Payer: BC Managed Care – PPO | Admitting: Family Medicine

## 2020-03-09 ENCOUNTER — Other Ambulatory Visit: Payer: Self-pay

## 2020-03-09 VITALS — BP 122/79 | HR 95 | Temp 97.8°F | Ht 73.0 in | Wt 297.0 lb

## 2020-03-09 DIAGNOSIS — Z6839 Body mass index (BMI) 39.0-39.9, adult: Secondary | ICD-10-CM

## 2020-03-09 DIAGNOSIS — R7303 Prediabetes: Secondary | ICD-10-CM | POA: Diagnosis not present

## 2020-03-09 DIAGNOSIS — E559 Vitamin D deficiency, unspecified: Secondary | ICD-10-CM | POA: Diagnosis not present

## 2020-03-09 DIAGNOSIS — Z9189 Other specified personal risk factors, not elsewhere classified: Secondary | ICD-10-CM

## 2020-03-09 MED ORDER — VITAMIN D (ERGOCALCIFEROL) 1.25 MG (50000 UNIT) PO CAPS: 50000.0000 [IU] | ORAL_CAPSULE | ORAL | 0 refills | Status: DC

## 2020-03-09 NOTE — Progress Notes (Signed)
Chief Complaint:   OBESITY Rodney Cherry is here to discuss his progress with his obesity treatment plan along with follow-up of his obesity related diagnoses. Rodney Cherry is on the Category 4 Plan and states he is following his eating plan approximately 75% of the time. Rodney Cherry states he is running up steps for 5 minutes 3-4 times per week.  Today's visit was #: 3 Starting weight: 296 lbs Starting date: 02/07/2020 Today's weight: 297 lbs Today's date: 03/09/2020 Total lbs lost to date: 0 Total lbs lost since last in-office visit: 3 lbs  Interim History: Rodney Cherry says his A/C went out for a few days and he had to stay at the [employer].  He tried to stay on plan while staying there. She has been doing more of the yogurt option at breakfast.  Dinner is more on plan the last few weeks.  For snacks, he is having cheese sticks but is interested in having almonds.  He is starting to feel a little better.  Subjective:   1. Vitamin D deficiency Rodney Cherry's Vitamin D level was 9.9 on 02/07/2019. He is currently taking prescription vitamin D 50,000 IU each week. He denies nausea, vomiting or muscle weakness.  He endorses fatigue.  2. Prediabetes Rodney Cherry has a diagnosis of prediabetes based on his elevated HgA1c and was informed this puts him at greater risk of developing diabetes. He continues to work on diet and exercise to decrease his risk of diabetes. He denies nausea or hypoglycemia.  He is not on medication.  Lab Results  Component Value Date   HGBA1C 6.3 12/31/2019   Lab Results  Component Value Date   INSULIN 51.4 (H) 02/07/2020   3. At risk for osteoporosis Rodney Cherry is at higher risk of osteopenia and osteoporosis due to Vitamin D deficiency.   Assessment/Plan:   1. Vitamin D deficiency Low Vitamin D level contributes to fatigue and are associated with obesity, breast, and colon cancer. He agrees to continue to take prescription Vitamin D @50 ,000 IU every week and will follow-up for routine testing  of Vitamin D, at least 2-3 times per year to avoid over-replacement. - Vitamin D, Ergocalciferol, (DRISDOL) 1.25 MG (50000 UNIT) CAPS capsule; Take 1 capsule (50,000 Units total) by mouth every 7 (seven) days.  Dispense: 4 capsule; Refill: 0  2. Prediabetes Rodney Cherry will continue to work on weight loss, exercise, and decreasing simple carbohydrates to help decrease the risk of diabetes.   3. At risk for osteoporosis Rodney Cherry was given approximately 15 minutes of osteoporosis prevention counseling today. Rodney Cherry is at risk for osteopenia and osteoporosis due to his Vitamin D deficiency. He was encouraged to take his Vitamin D and follow his higher calcium diet and increase strengthening exercise to help strengthen his bones and decrease his risk of osteopenia and osteoporosis.  Repetitive spaced learning was employed today to elicit superior memory formation and behavioral change.  4. Class 2 severe obesity with serious comorbidity and body mass index (BMI) of 39.0 to 39.9 in adult, unspecified obesity type (HCC) Rodney Cherry is currently in the action stage of change. As such, his goal is to continue with weight loss efforts. He has agreed to the Category 4 Plan.   Exercise goals: For substantial health benefits, adults should do at least 150 minutes (2 hours and 30 minutes) a week of moderate-intensity, or 75 minutes (1 hour and 15 minutes) a week of vigorous-intensity aerobic physical activity, or an equivalent combination of moderate- and vigorous-intensity aerobic activity. Aerobic activity should  be performed in episodes of at least 10 minutes, and preferably, it should be spread throughout the week.  Behavioral modification strategies: increasing lean protein intake, meal planning and cooking strategies and keeping healthy foods in the home.  Rodney Cherry has agreed to follow-up with our clinic in 2-3 weeks. He was informed of the importance of frequent follow-up visits to maximize his success with intensive  lifestyle modifications for his multiple health conditions.   Objective:   Blood pressure 122/79, pulse 95, temperature 97.8 F (36.6 C), temperature source Oral, height 6\' 1"  (1.854 m), weight 297 lb (134.7 kg), SpO2 95 %. Body mass index is 39.18 kg/m.  General: Cooperative, alert, well developed, in no acute distress. HEENT: Conjunctivae and lids unremarkable. Cardiovascular: Regular rhythm.  Lungs: Normal work of breathing. Neurologic: No focal deficits.   Lab Results  Component Value Date   CREATININE 1.03 12/17/2019   BUN 11 12/17/2019   NA 135 12/17/2019   K 4.3 12/17/2019   CL 102 12/17/2019   CO2 25 12/17/2019   Lab Results  Component Value Date   ALT 35 12/17/2019   AST 27 12/17/2019   ALKPHOS 72 12/17/2019   BILITOT 0.7 12/17/2019   Lab Results  Component Value Date   HGBA1C 6.3 12/31/2019   Lab Results  Component Value Date   INSULIN 51.4 (H) 02/07/2020   Lab Results  Component Value Date   TSH 1.30 12/17/2019   Lab Results  Component Value Date   CHOL 202 (H) 12/17/2019   HDL 43.10 12/17/2019   LDLDIRECT 151.0 12/17/2019   TRIG 208.0 (H) 12/17/2019   CHOLHDL 5 12/17/2019   Lab Results  Component Value Date   WBC 8.2 12/31/2019   HGB 15.4 12/31/2019   HCT 46.0 12/31/2019   MCV 82.3 12/31/2019   PLT 253.0 12/31/2019   Attestation Statements:   Reviewed by clinician on day of visit: allergies, medications, problem list, medical history, surgical history, family history, social history, and previous encounter notes.  I, Water quality scientist, CMA, am acting as transcriptionist for Coralie Common, MD.  I have reviewed the above documentation for accuracy and completeness, and I agree with the above. - Jinny Blossom, MD

## 2020-03-15 ENCOUNTER — Other Ambulatory Visit (INDEPENDENT_AMBULATORY_CARE_PROVIDER_SITE_OTHER): Payer: Self-pay | Admitting: Family Medicine

## 2020-03-15 DIAGNOSIS — E559 Vitamin D deficiency, unspecified: Secondary | ICD-10-CM

## 2020-03-22 DIAGNOSIS — F332 Major depressive disorder, recurrent severe without psychotic features: Secondary | ICD-10-CM | POA: Diagnosis not present

## 2020-03-22 DIAGNOSIS — F4310 Post-traumatic stress disorder, unspecified: Secondary | ICD-10-CM | POA: Diagnosis not present

## 2020-03-29 DIAGNOSIS — F332 Major depressive disorder, recurrent severe without psychotic features: Secondary | ICD-10-CM | POA: Diagnosis not present

## 2020-03-29 DIAGNOSIS — F4310 Post-traumatic stress disorder, unspecified: Secondary | ICD-10-CM | POA: Diagnosis not present

## 2020-04-03 ENCOUNTER — Ambulatory Visit (INDEPENDENT_AMBULATORY_CARE_PROVIDER_SITE_OTHER): Payer: BC Managed Care – PPO | Admitting: Family Medicine

## 2020-04-05 DIAGNOSIS — F4310 Post-traumatic stress disorder, unspecified: Secondary | ICD-10-CM | POA: Diagnosis not present

## 2020-04-05 DIAGNOSIS — F332 Major depressive disorder, recurrent severe without psychotic features: Secondary | ICD-10-CM | POA: Diagnosis not present

## 2020-04-06 ENCOUNTER — Encounter (INDEPENDENT_AMBULATORY_CARE_PROVIDER_SITE_OTHER): Payer: Self-pay

## 2020-04-06 ENCOUNTER — Ambulatory Visit (INDEPENDENT_AMBULATORY_CARE_PROVIDER_SITE_OTHER): Payer: BC Managed Care – PPO | Admitting: Family Medicine

## 2020-04-11 ENCOUNTER — Encounter: Payer: Self-pay | Admitting: Family Medicine

## 2020-04-11 ENCOUNTER — Other Ambulatory Visit: Payer: Self-pay

## 2020-04-11 ENCOUNTER — Encounter (INDEPENDENT_AMBULATORY_CARE_PROVIDER_SITE_OTHER): Payer: Self-pay | Admitting: Adult Health

## 2020-04-11 ENCOUNTER — Ambulatory Visit (INDEPENDENT_AMBULATORY_CARE_PROVIDER_SITE_OTHER): Payer: BC Managed Care – PPO | Admitting: Adult Health

## 2020-04-11 VITALS — BP 131/86 | HR 97 | Temp 98.5°F | Ht 73.0 in | Wt 298.0 lb

## 2020-04-11 DIAGNOSIS — Z6839 Body mass index (BMI) 39.0-39.9, adult: Secondary | ICD-10-CM

## 2020-04-11 DIAGNOSIS — E559 Vitamin D deficiency, unspecified: Secondary | ICD-10-CM

## 2020-04-11 DIAGNOSIS — Z9189 Other specified personal risk factors, not elsewhere classified: Secondary | ICD-10-CM | POA: Diagnosis not present

## 2020-04-11 DIAGNOSIS — J4530 Mild persistent asthma, uncomplicated: Secondary | ICD-10-CM | POA: Diagnosis not present

## 2020-04-11 MED ORDER — BREO ELLIPTA 100-25 MCG/INH IN AEPB: 1.0000 | INHALATION_SPRAY | Freq: Every day | RESPIRATORY_TRACT | 1 refills | Status: DC

## 2020-04-11 MED ORDER — VITAMIN D (ERGOCALCIFEROL) 1.25 MG (50000 UNIT) PO CAPS: 50000.0000 [IU] | ORAL_CAPSULE | ORAL | 0 refills | Status: DC

## 2020-04-12 DIAGNOSIS — F4310 Post-traumatic stress disorder, unspecified: Secondary | ICD-10-CM | POA: Diagnosis not present

## 2020-04-12 DIAGNOSIS — Z6839 Body mass index (BMI) 39.0-39.9, adult: Secondary | ICD-10-CM | POA: Insufficient documentation

## 2020-04-12 DIAGNOSIS — F332 Major depressive disorder, recurrent severe without psychotic features: Secondary | ICD-10-CM | POA: Diagnosis not present

## 2020-04-12 NOTE — Progress Notes (Signed)
Chief Complaint:   OBESITY Rodney Maul. is here to discuss his progress with his obesity treatment plan along with follow-up of his obesity related diagnoses. Rodney Cherry is on the Category 4 Plan and states he is following his eating plan approximately 50% of the time. Rodney Cherry states he is exercising 0 minutes 0 times per week.  Today's visit was #: 4 Starting weight: 296 lbs Starting date: 02/07/2020 Today's weight: 298 lbs Today's date: 04/11/2020 Total lbs lost to date: 0 Total lbs lost since last in-office visit: 0  Interim History: Rodney Cherry recently traveled to [place] in [place] for his wife's grandfather's funeral. Out of respect for his in-laws, he ate the traditional [employer] food, which was high in saturated fat and carbohydrate content. Now that he is home, he can focus on resuming following the Category 4 meal plan consistently.  Subjective:   Vitamin D deficiency. Rodney Cherry is on prescription strength Vitamin D supplementation and is tolerating it well. No nausea, vomiting, or muscle weakness.    Ref. Range 02/07/2020 14:49  Vitamin D, 25-Hydroxy Latest Ref Range: 30.0 - 100.0 ng/mL 9.9 (L)   Mild persistent asthma, unspecified whether complicated. Rodney Cherry quit tobacco use greater than 7 years ago. He uses Breo inhaler every other day. He has previously had his PCP refill his inhaler, however, he has not seen his provider in greater than 6 months. He requests RF on inhaler today.  At risk for activity intolerance. Rodney Cherry is at risk of exercise intolerance due to mild persistent asthma.  Assessment/Plan:   Vitamin D deficiency. Low Vitamin D level contributes to fatigue and are associated with obesity, breast, and colon cancer. He was given a refill on his Vitamin D, Ergocalciferol, (DRISDOL) 1.25 MG (50000 UNIT) CAPS capsule every week #4 with 0 refills and will continue OTC Vitamin D3 5,000 IU daily. He will follow-up for routine testing of Vitamin D every 3 months.  Mild  persistent asthma, unspecified whether complicated. Refill was given for fluticasone furoate-vilanterol (BREO ELLIPTA) 100-25 MCG/INH AEPB 1 puff into lungs daily, #1 inhaler with 1 refill.  At risk for activity intolerance. Rodney Cherry was given approximately 15 minutes of exercise intolerance counseling today. He is 39 y.o. male and has risk factors exercise intolerance including obesity. We discussed intensive lifestyle modifications today with an emphasis on specific weight loss instructions and strategies. Rodney Cherry will slowly increase activity as tolerated.  Repetitive spaced learning was employed today to elicit superior memory formation and behavioral change.  Class 2 severe obesity with serious comorbidity and body mass index (BMI) of 39.0 to 39.9 in adult, unspecified obesity type ([place]).  Rodney Cherry is currently in the action stage of change. As such, his goal is to continue with weight loss efforts. He has agreed to the Category 4 Plan and will journal 550-700 calories and 45+ grams of protein at supper.   Exercise goals: No exercise has been prescribed at this time.  Behavioral modification strategies: increasing lean protein intake, decreasing simple carbohydrates, meal planning and cooking strategies, celebration eating strategies and planning for success.  Rodney Cherry has agreed to follow-up with our clinic in 2 weeks. He was informed of the importance of frequent follow-up visits to maximize his success with intensive lifestyle modifications for his multiple health conditions.   Objective:   Blood pressure 131/86, pulse 97, temperature 98.5 F (36.9 C), temperature source Oral, height 6\' 1"  (1.854 m), weight 298 lb (135.2 kg), SpO2 95 %. Body mass index is 39.32 kg/m.  General: Cooperative, alert, well developed, in no acute distress. HEENT: Conjunctivae and lids unremarkable. Cardiovascular: Regular rhythm.  Lungs: Normal work of breathing. Neurologic: No focal deficits.   Lab Results    Component Value Date   CREATININE 1.03 12/17/2019   BUN 11 12/17/2019   NA 135 12/17/2019   K 4.3 12/17/2019   CL 102 12/17/2019   CO2 25 12/17/2019   Lab Results  Component Value Date   ALT 35 12/17/2019   AST 27 12/17/2019   ALKPHOS 72 12/17/2019   BILITOT 0.7 12/17/2019   Lab Results  Component Value Date   HGBA1C 6.3 12/31/2019   Lab Results  Component Value Date   INSULIN 51.4 (H) 02/07/2020   Lab Results  Component Value Date   TSH 1.30 12/17/2019   Lab Results  Component Value Date   CHOL 202 (H) 12/17/2019   HDL 43.10 12/17/2019   LDLDIRECT 151.0 12/17/2019   TRIG 208.0 (H) 12/17/2019   CHOLHDL 5 12/17/2019   Lab Results  Component Value Date   WBC 8.2 12/31/2019   HGB 15.4 12/31/2019   HCT 46.0 12/31/2019   MCV 82.3 12/31/2019   PLT 253.0 12/31/2019   No results found for: IRON, TIBC, FERRITIN  Attestation Statements:   Reviewed by clinician on day of visit: allergies, medications, problem list, medical history, surgical history, family history, social history, and previous encounter notes.  I, Michaelene Song, am acting as Location manager for [employer], NP-C   I have reviewed the above documentation for accuracy and completeness, and I agree with the above. -  Esaw Grandchild, NP

## 2020-04-19 DIAGNOSIS — F332 Major depressive disorder, recurrent severe without psychotic features: Secondary | ICD-10-CM | POA: Diagnosis not present

## 2020-04-19 DIAGNOSIS — F4310 Post-traumatic stress disorder, unspecified: Secondary | ICD-10-CM | POA: Diagnosis not present

## 2020-04-20 ENCOUNTER — Other Ambulatory Visit: Payer: Self-pay

## 2020-04-21 ENCOUNTER — Ambulatory Visit (INDEPENDENT_AMBULATORY_CARE_PROVIDER_SITE_OTHER): Payer: BC Managed Care – PPO | Admitting: Family Medicine

## 2020-04-21 ENCOUNTER — Encounter: Payer: Self-pay | Admitting: Family Medicine

## 2020-04-21 VITALS — BP 102/70 | HR 109 | Temp 98.3°F | Ht 73.0 in | Wt 304.2 lb

## 2020-04-21 DIAGNOSIS — F988 Other specified behavioral and emotional disorders with onset usually occurring in childhood and adolescence: Secondary | ICD-10-CM

## 2020-04-21 DIAGNOSIS — Z91018 Allergy to other foods: Secondary | ICD-10-CM

## 2020-04-21 MED ORDER — AMPHETAMINE-DEXTROAMPHET ER 15 MG PO CP24: 15.0000 mg | ORAL_CAPSULE | ORAL | 0 refills | Status: DC

## 2020-04-21 MED ORDER — EPINEPHRINE 0.3 MG/0.3ML IJ SOAJ: 0.3000 mg | INTRAMUSCULAR | 2 refills | Status: AC | PRN

## 2020-04-21 NOTE — Progress Notes (Signed)
Prakash Kimberling. is a 39 y.o. male  Chief Complaint  Patient presents with  . Allergic Reaction    Pt c/o allergic reaction to mushroom about 2 months ago.  Pt would like to discuss anxiety medications.  Pt expalins that he is having more problems foucusing and mind racing at night.    HPI: Rodney Cherry. is a 39 y.o. male who complains of years of generalized anxiety that has never been addressed. He notes a "dread of the day" most AMs. He has trouble falling asleep and staying asleep. Mind racing. Trouble focusing. Jumps from task to task. Pt notes being fidgety, picking at nails.   He is on latuda which helps with depression, and bipolar. He was tried on zoloft, paxil, prozac, effexor, and possible others including buspar.   Pt was diagnosed with ADD as a child but parents were against medication.    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 Started as hives/rash but has progressed to tongue swelling and SOB. Would like epipen to have on hand.   Past Medical History:  Diagnosis Date  . Allergy   . Anxiety   . Asthma   . Back pain   . Bipolar 1 disorder ([place])   . Chronic headaches   . Depression   . Drug use   . GERD (gastroesophageal reflux disease)   . History of kidney problems   . Migraine   . Mild intermittent asthma 01/29/2018  . Multiple food allergies   . Overweight   . Palpitations   . Ringing in ears   . Seizures ([place])   . Shortness of breath   . Sleep apnea   . Syncopal episodes     Past Surgical History:  Procedure Laterality Date  . APPENDECTOMY    . CHOLECYSTECTOMY    . ESOPHAGOGASTRODUODENOSCOPY  01/24/2015   Dr. Glennon Hamilton. [facility]  . FLEXIBLE SIGMOIDOSCOPY  01/24/2015   Dr Glennon Hamilton. [facility]  . NASAL SEPTUM SURGERY    . VASECTOMY    . WISDOM TOOTH EXTRACTION      Social History   Socioeconomic History  . Marital status: Married    Spouse name: Not on file  . Number of children: 1  . Years of education: Not on file  . Highest  education level: Some college, no degree  Occupational History  . Not on file  Tobacco Use  . Smoking status: Former Smoker    Quit date: 02/09/2014    Years since quitting: 6.2  . Smokeless tobacco: Never Used  Vaping Use  . Vaping Use: Never used  Substance and Sexual Activity  . Alcohol use: Yes    Comment: rare  . Drug use: Never  . Sexual activity: Yes    Birth control/protection: None  Other Topics Concern  . Not on file  Social History Narrative  . Not on file   Social Determinants of Health   Financial Resource Strain:   . Difficulty of Paying Living Expenses:   Food Insecurity:   . Worried About Charity fundraiser in the Last Year:   . Arboriculturist in the Last Year:   Transportation Needs:   . Film/video editor (Medical):   Marland Kitchen Lack of Transportation (Non-Medical):   Physical Activity:   . Days of Exercise per Week:   . Minutes of Exercise per Session:   Stress:   . Feeling of Stress :   Social Connections:   . Frequency  of Communication with Friends and Family:   . Frequency of Social Gatherings with Friends and Family:   . Attends Religious Services:   . Active Member of Clubs or Organizations:   . Attends Archivist Meetings:   Marland Kitchen Marital Status:   Intimate Partner Violence:   . Fear of Current or Ex-Partner:   . Emotionally Abused:   Marland Kitchen Physically Abused:   . Sexually Abused:     Family History  Problem Relation Age of Onset  . Arthritis Mother   . Diabetes Mother   . Hearing loss Mother   . Hyperlipidemia Mother   . Hypertension Mother   . Kidney disease Mother   . Stroke Mother   . Eating disorder Mother   . Obesity Mother   . Alcohol abuse Father   . Arthritis Father   . Asthma Father   . Cancer Father   . Depression Father   . Drug abuse Father   . Hearing loss Father   . Heart attack Father   . Heart disease Father   . Hyperlipidemia Father   . Hypertension Father   . Kidney disease Father   . Stroke Father   .  Anxiety disorder Father   . Bipolar disorder Father   . Schizophrenia Father   . Liver disease Father   . Sleep apnea Father   . Obesity Father   . Stroke Sister   . Depression Sister   . Diabetes Sister   . Hearing loss Sister   . Kidney disease Sister   . Learning disabilities Sister   . Mental illness Sister   . Asthma Daughter   . Arthritis Maternal Grandmother   . Cancer Maternal Grandmother   . Diabetes Maternal Grandmother   . Hypertension Maternal Grandmother   . Kidney disease Maternal Grandmother   . Stroke Maternal Grandmother   . Arthritis Maternal Grandfather   . Cancer Maternal Grandfather   . Heart attack Maternal Grandfather   . Arthritis Paternal Grandmother   . Alcohol abuse Paternal Grandmother   . Cancer Paternal Grandmother   . COPD Paternal Grandmother   . Diabetes Paternal Grandmother   . Heart attack Paternal Grandmother   . Hearing loss Paternal Grandmother   . Hypertension Paternal Grandmother   . Arthritis Paternal Grandfather   . Alcohol abuse Paternal Grandfather   . Cancer Paternal Grandfather   . COPD Paternal Grandfather   . Diabetes Paternal Grandfather   . Heart attack Paternal Grandfather   . Heart disease Paternal Grandfather   . Hearing loss Paternal Grandfather   . Hypertension Paternal Grandfather   . Stroke Paternal Grandfather   . Colon cancer Paternal Grandfather   . Birth defects Sister   . Esophageal cancer Neg Hx      Immunization History  Administered Date(s) Administered  . Influenza,inj,Quad PF,6+ Mos 07/27/2018, 12/17/2019  . Tdap 01/29/2018    Outpatient Encounter Medications as of 04/21/2020  Medication Sig  . albuterol (VENTOLIN HFA) 108 (90 Base) MCG/ACT inhaler Inhale 2 puffs into the lungs every 6 (six) hours as needed for wheezing or shortness of breath.  . fluticasone furoate-vilanterol (BREO ELLIPTA) 100-25 MCG/INH AEPB Inhale 1 puff into the lungs daily.  . Galcanezumab-gnlm (EMGALITY) 120 MG/ML SOAJ  INJECT 1 ML INTO THE SKIN EVERY 30 (THIRTY) DAYS.  Marland Kitchen LATUDA 40 MG TABS tablet Take 1 tablet (40 mg total) by mouth at bedtime.  . NURTEC 75 MG TBDP 1 tablet as needed.   . pantoprazole (  PROTONIX) 40 MG tablet TAKE 1 TABLET BY MOUTH EVERY DAY  . promethazine (PHENERGAN) 25 MG tablet Take 25 mg by mouth every 6 (six) hours as needed for nausea or vomiting.  . traZODone (DESYREL) 50 MG tablet Take 50 mg by mouth at bedtime as needed. for sleep  . Vitamin D, Ergocalciferol, (DRISDOL) 1.25 MG (50000 UNIT) CAPS capsule Take 1 capsule (50,000 Units total) by mouth every 7 (seven) days.   No facility-administered encounter medications on file as of 04/21/2020.     ROS: Pertinent positives and negatives noted in HPI. Remainder of ROS non-contributory    Allergies  Allergen Reactions  . Mushroom Extract Complex Shortness Of Breath and Swelling  . 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 Escitalopram Oxalate Rash  . Hydrocodone Other (See Comments)    Rebound headaches  . Latex Rash  . Morphine Other (See Comments)    Severe headaches Severe migraine   . Other Other (See Comments)    All codeine medication increase migraine symptoms  . Sulfa Antibiotics Other (See Comments)    Causes glaucoma  VISION LOSS     BP 102/70 (BP Location: Left Arm, Patient Position: Sitting, Cuff Size: Large)   Pulse (!) 109   Temp 98.3 F (36.8 C) (Temporal)   Ht 6\' 1"  (1.854 m)   Wt (!) 304 lb 3.2 oz (138 kg)   SpO2 97%   BMI 40.13 kg/m   Physical Exam Constitutional:      General: He is not in acute distress.    Appearance: Normal appearance. He is obese. He is not ill-appearing.  Cardiovascular:     Rate and Rhythm: Normal rate and regular rhythm.  Pulmonary:     Breath sounds: No wheezing.  Neurological:     General: No focal deficit present.     Mental Status: He is alert and oriented to person, place, and time.  Psychiatric:         Mood and Affect: Mood normal.        Behavior: Behavior normal.      A/P:  1. Attention deficit disorder, unspecified hyperactivity presence - trial of medication and if no improvement, will refer to psych Rx: - amphetamine-dextroamphetamine (ADDERALL XR) 15 MG 24 hr capsule; Take 1 capsule by mouth every morning.  Dispense: 30 capsule; Refill: 0 - f/u in 2 wks or sooner PRN  2. Food allergy Rx: - EPINEPHrine 0.3 mg/0.3 mL IJ SOAJ injection; Inject 0.3 mLs (0.3 mg total) into the muscle as needed for anaphylaxis.  Dispense: 1 each; Refill: 2   This visit occurred during the SARS-CoV-2 public health emergency.  Safety protocols were in place, including screening questions prior to the visit, additional usage of staff PPE, and extensive cleaning of exam room while observing appropriate contact time as indicated for disinfecting solutions.

## 2020-04-22 ENCOUNTER — Encounter: Payer: Self-pay | Admitting: Family Medicine

## 2020-04-25 ENCOUNTER — Other Ambulatory Visit: Payer: Self-pay

## 2020-04-25 ENCOUNTER — Encounter (INDEPENDENT_AMBULATORY_CARE_PROVIDER_SITE_OTHER): Payer: Self-pay | Admitting: Family Medicine

## 2020-04-25 ENCOUNTER — Ambulatory Visit (INDEPENDENT_AMBULATORY_CARE_PROVIDER_SITE_OTHER): Payer: BC Managed Care – PPO | Admitting: Family Medicine

## 2020-04-25 VITALS — BP 117/82 | HR 100 | Temp 98.3°F | Ht 73.0 in | Wt 298.0 lb

## 2020-04-25 DIAGNOSIS — E559 Vitamin D deficiency, unspecified: Secondary | ICD-10-CM

## 2020-04-25 DIAGNOSIS — R7303 Prediabetes: Secondary | ICD-10-CM | POA: Diagnosis not present

## 2020-04-25 DIAGNOSIS — Z6839 Body mass index (BMI) 39.0-39.9, adult: Secondary | ICD-10-CM

## 2020-04-26 DIAGNOSIS — F4310 Post-traumatic stress disorder, unspecified: Secondary | ICD-10-CM | POA: Diagnosis not present

## 2020-04-26 DIAGNOSIS — F332 Major depressive disorder, recurrent severe without psychotic features: Secondary | ICD-10-CM | POA: Diagnosis not present

## 2020-04-27 NOTE — Progress Notes (Signed)
Chief Complaint:   OBESITY Paco is here to discuss his progress with his obesity treatment plan along with follow-up of his obesity related diagnoses. Iven is on the Category 4 Plan and keeping a food journal and adhering to recommended goals of 550-700 calories and 45+ grams of protein at supper daily and states he is following his eating plan approximately 80% of the time. Kai states he is doing 0 minutes 0 times per week.  Today's visit was #: 5 Starting weight: 296 lbs Starting date: 02/07/2020 Today's weight: 298 lbs Today's date: 04/25/2020 Total lbs lost to date: 0 Total lbs lost since last in-office visit: 0  Interim History: Datrell voices that he had a significant increase in number of migraines, likely due to change in weather and increase in stress. He finds himself more hungry than not. He is trying to get in 8 oz of meat at dinner. When he is hungry he wants to eat sushi or ramen. He id doing yogurt with almonds for snack and creamer and coffee.  Subjective:   1. Pre-diabetes Azlan's last A1c was 6.3 and insulin 51.4. He is not on medications, and he notes hunger.  2. Vitamin D deficiency Karnell denies nausea, vomiting, or muscle weakness, but he notes fatigue. He is on prescription Vit D.  Assessment/Plan:   1. Pre-diabetes Boubacar will continue to work on weight loss, exercise, and decreasing simple carbohydrates to help decrease the risk of diabetes. We will repeat labs in 1 month.  2. Vitamin D deficiency Low Vitamin D level contributes to fatigue and are associated with obesity, breast, and colon cancer. Johan agreed to continue taking prescription Vitamin D 50,000 IU every week and will follow-up for routine testing of Vitamin D, at least 2-3 times per year to avoid over-replacement.  3. Class 2 severe obesity with serious comorbidity and body mass index (BMI) of 39.0 to 39.9 in adult, unspecified obesity type (HCC) Marios is currently in the action stage of  change. As such, his goal is to continue with weight loss efforts. He has agreed to the Category 4 Plan.   Exercise goals: No exercise has been prescribed at this time.  Behavioral modification strategies: increasing lean protein intake, meal planning and cooking strategies, keeping healthy foods in the home and planning for success.  Artemio has agreed to follow-up with our clinic in 2 weeks, with Dr. Willy Eddy. He was informed of the importance of frequent follow-up visits to maximize his success with intensive lifestyle modifications for his multiple health conditions.   Objective:   Blood pressure 117/82, pulse 100, temperature 98.3 F (36.8 C), temperature source Oral, height 6\' 1"  (1.854 m), weight 298 lb (135.2 kg), SpO2 97 %. Body mass index is 39.32 kg/m.  General: Cooperative, alert, well developed, in no acute distress. HEENT: Conjunctivae and lids unremarkable. Cardiovascular: Regular rhythm.  Lungs: Normal work of breathing. Neurologic: No focal deficits.   Lab Results  Component Value Date   CREATININE 1.03 12/17/2019   BUN 11 12/17/2019   NA 135 12/17/2019   K 4.3 12/17/2019   CL 102 12/17/2019   CO2 25 12/17/2019   Lab Results  Component Value Date   ALT 35 12/17/2019   AST 27 12/17/2019   ALKPHOS 72 12/17/2019   BILITOT 0.7 12/17/2019   Lab Results  Component Value Date   HGBA1C 6.3 12/31/2019   Lab Results  Component Value Date   INSULIN 51.4 (H) 02/07/2020   Lab Results  Component  Value Date   TSH 1.30 12/17/2019   Lab Results  Component Value Date   CHOL 202 (H) 12/17/2019   HDL 43.10 12/17/2019   LDLDIRECT 151.0 12/17/2019   TRIG 208.0 (H) 12/17/2019   CHOLHDL 5 12/17/2019   Lab Results  Component Value Date   WBC 8.2 12/31/2019   HGB 15.4 12/31/2019   HCT 46.0 12/31/2019   MCV 82.3 12/31/2019   PLT 253.0 12/31/2019   No results found for: IRON, TIBC, FERRITIN  Attestation Statements:   Reviewed by clinician on day of visit:  allergies, medications, problem list, medical history, surgical history, family history, social history, and previous encounter notes.  Time spent on visit including pre-visit chart review and post-visit care and charting was 15 minutes.    I, Trixie Dredge, am acting as transcriptionist for Coralie Common, MD.  I have reviewed the above documentation for accuracy and completeness, and I agree with the above. - Jinny Blossom, MD

## 2020-05-04 ENCOUNTER — Other Ambulatory Visit (INDEPENDENT_AMBULATORY_CARE_PROVIDER_SITE_OTHER): Payer: Self-pay | Admitting: Adult Health

## 2020-05-04 DIAGNOSIS — E559 Vitamin D deficiency, unspecified: Secondary | ICD-10-CM

## 2020-05-05 DIAGNOSIS — F4310 Post-traumatic stress disorder, unspecified: Secondary | ICD-10-CM | POA: Diagnosis not present

## 2020-05-05 DIAGNOSIS — F332 Major depressive disorder, recurrent severe without psychotic features: Secondary | ICD-10-CM | POA: Diagnosis not present

## 2020-05-08 ENCOUNTER — Ambulatory Visit (INDEPENDENT_AMBULATORY_CARE_PROVIDER_SITE_OTHER): Payer: BC Managed Care – PPO | Admitting: Family Medicine

## 2020-05-10 ENCOUNTER — Encounter: Payer: Self-pay | Admitting: Family Medicine

## 2020-05-10 DIAGNOSIS — F988 Other specified behavioral and emotional disorders with onset usually occurring in childhood and adolescence: Secondary | ICD-10-CM

## 2020-05-12 DIAGNOSIS — F332 Major depressive disorder, recurrent severe without psychotic features: Secondary | ICD-10-CM | POA: Diagnosis not present

## 2020-05-12 DIAGNOSIS — F4310 Post-traumatic stress disorder, unspecified: Secondary | ICD-10-CM | POA: Diagnosis not present

## 2020-05-12 MED ORDER — AMPHETAMINE-DEXTROAMPHET ER 25 MG PO CP24: 25.0000 mg | ORAL_CAPSULE | ORAL | 0 refills | Status: DC

## 2020-05-16 ENCOUNTER — Encounter: Payer: Self-pay | Admitting: Family Medicine

## 2020-06-06 ENCOUNTER — Encounter: Payer: Self-pay | Admitting: Family Medicine

## 2020-06-07 MED ORDER — AMPHETAMINE-DEXTROAMPHET ER 30 MG PO CP24: 30.0000 mg | ORAL_CAPSULE | ORAL | 0 refills | Status: DC

## 2020-06-30 ENCOUNTER — Encounter: Payer: Self-pay | Admitting: Family Medicine

## 2020-07-22 ENCOUNTER — Encounter: Payer: Self-pay | Admitting: Family Medicine

## 2020-07-24 ENCOUNTER — Other Ambulatory Visit: Payer: Self-pay

## 2020-07-24 MED ORDER — PANTOPRAZOLE SODIUM 40 MG PO TBEC: 40.0000 mg | DELAYED_RELEASE_TABLET | Freq: Every day | ORAL | 1 refills | Status: DC

## 2020-10-12 DIAGNOSIS — R7303 Prediabetes: Secondary | ICD-10-CM | POA: Diagnosis not present

## 2020-10-12 DIAGNOSIS — Z6839 Body mass index (BMI) 39.0-39.9, adult: Secondary | ICD-10-CM | POA: Diagnosis not present

## 2020-10-12 DIAGNOSIS — E6609 Other obesity due to excess calories: Secondary | ICD-10-CM | POA: Diagnosis not present

## 2020-10-17 DIAGNOSIS — Z9989 Dependence on other enabling machines and devices: Secondary | ICD-10-CM | POA: Diagnosis not present

## 2020-10-17 DIAGNOSIS — G4733 Obstructive sleep apnea (adult) (pediatric): Secondary | ICD-10-CM | POA: Diagnosis not present

## 2020-10-17 DIAGNOSIS — F3181 Bipolar II disorder: Secondary | ICD-10-CM | POA: Diagnosis not present

## 2020-10-17 DIAGNOSIS — G43719 Chronic migraine without aura, intractable, without status migrainosus: Secondary | ICD-10-CM | POA: Diagnosis not present

## 2020-10-20 ENCOUNTER — Other Ambulatory Visit: Payer: Self-pay

## 2020-10-23 DIAGNOSIS — F3132 Bipolar disorder, current episode depressed, moderate: Secondary | ICD-10-CM | POA: Diagnosis not present

## 2020-10-23 DIAGNOSIS — F909 Attention-deficit hyperactivity disorder, unspecified type: Secondary | ICD-10-CM | POA: Diagnosis not present

## 2020-10-23 DIAGNOSIS — G4700 Insomnia, unspecified: Secondary | ICD-10-CM | POA: Diagnosis not present

## 2020-10-24 ENCOUNTER — Ambulatory Visit (INDEPENDENT_AMBULATORY_CARE_PROVIDER_SITE_OTHER): Payer: Medicare Other | Admitting: Family Medicine

## 2020-10-24 ENCOUNTER — Encounter: Payer: Self-pay | Admitting: Family Medicine

## 2020-10-24 ENCOUNTER — Other Ambulatory Visit: Payer: Self-pay

## 2020-10-24 VITALS — BP 122/80 | HR 101 | Temp 99.0°F | Ht 73.0 in | Wt 308.2 lb

## 2020-10-24 DIAGNOSIS — R7303 Prediabetes: Secondary | ICD-10-CM

## 2020-10-24 DIAGNOSIS — E559 Vitamin D deficiency, unspecified: Secondary | ICD-10-CM | POA: Diagnosis not present

## 2020-10-24 DIAGNOSIS — E785 Hyperlipidemia, unspecified: Secondary | ICD-10-CM

## 2020-10-24 DIAGNOSIS — L03012 Cellulitis of left finger: Secondary | ICD-10-CM

## 2020-10-24 DIAGNOSIS — E1165 Type 2 diabetes mellitus with hyperglycemia: Secondary | ICD-10-CM

## 2020-10-24 DIAGNOSIS — Z23 Encounter for immunization: Secondary | ICD-10-CM

## 2020-10-24 MED ORDER — CEPHALEXIN 500 MG PO CAPS: 500.0000 mg | ORAL_CAPSULE | Freq: Two times a day (BID) | ORAL | 0 refills | Status: AC

## 2020-10-24 MED ORDER — METFORMIN HCL ER 500 MG PO TB24: ORAL_TABLET | ORAL | 3 refills | Status: AC

## 2020-10-24 NOTE — Addendum Note (Signed)
Addended by: Konrad Saha on: 10/24/2020 02:39 PM   Modules accepted: Orders

## 2020-10-24 NOTE — Progress Notes (Signed)
Rodney Cherry. is a 40 y.o. male  Chief Complaint  Patient presents with  . Follow-up    F/u Diabetes/labs.  C/o left thumb sore due to picking at it.       HPI: Rodney Cherry. is a 40 y.o. male seen today for routine f/u on chronic medical issues including IFG, HLD, Vit D deficiency. He is due for labs today.  Pt is not on any meds for IFG or HLD. Pt states he had A1C on 10/13/19 and it was 9.2. Was told to f/u with PCP.  He is taking a Vit D supplement 15,000IU every other day. He would like flu vaccine today. He complains of Lt thumb pain. He states he picks at the sides of his thumbs and feels it may have gotten infected. He has been applying tripe abx ointment. He used tweezers a few days ago to get it to start draining.   Lab Results  Component Value Date   HGBA1C 6.3 12/31/2019   Lab Results  Component Value Date   CHOL 202 (H) 12/17/2019   HDL 43.10 12/17/2019   LDLDIRECT 151.0 12/17/2019   TRIG 208.0 (H) 12/17/2019   CHOLHDL 5 12/17/2019   Lab Results  Component Value Date   NA 135 12/17/2019   K 4.3 12/17/2019   CREATININE 1.03 12/17/2019   GLUCOSE 129 (H) 12/17/2019   Lab Results  Component Value Date   ALT 35 12/17/2019   AST 27 12/17/2019   ALKPHOS 72 12/17/2019   BILITOT 0.7 12/17/2019   Lab Results  Component Value Date   TSH 1.30 12/17/2019   Last vitamin D Lab Results  Component Value Date   VD25OH 9.9 (L) 02/07/2020    Past Medical History:  Diagnosis Date  . Allergy   . Anxiety   . Asthma   . Back pain   . Bipolar 1 disorder ([place])   . Chronic headaches   . Depression   . Drug use   . GERD (gastroesophageal reflux disease)   . History of kidney problems   . Migraine   . Mild intermittent asthma 01/29/2018  . Multiple food allergies   . Overweight   . Palpitations   . Ringing in ears   . Seizures ([place])   . Shortness of breath   . Sleep apnea   . Syncopal episodes     Past Surgical History:  Procedure Laterality Date  .  APPENDECTOMY    . CHOLECYSTECTOMY    . ESOPHAGOGASTRODUODENOSCOPY  01/24/2015   Dr. Glennon Hamilton. [facility]  . FLEXIBLE SIGMOIDOSCOPY  01/24/2015   Dr Glennon Hamilton. [facility]  . NASAL SEPTUM SURGERY    . VASECTOMY    . WISDOM TOOTH EXTRACTION      Social History   Socioeconomic History  . Marital status: Married    Spouse name: Not on file  . Number of children: 1  . Years of education: Not on file  . Highest education level: Some college, no degree  Occupational History  . Not on file  Tobacco Use  . Smoking status: Former Smoker    Quit date: 02/09/2014    Years since quitting: 6.7  . Smokeless tobacco: Never Used  Vaping Use  . Vaping Use: Never used  Substance and Sexual Activity  . Alcohol use: Yes    Comment: rare  . Drug use: Never  . Sexual activity: Yes    Birth control/protection: None  Other Topics Concern  . Not on  file  Social History Narrative  . Not on file   Social Determinants of Health   Financial Resource Strain: Not on file  Food Insecurity: Not on file  Transportation Needs: Not on file  Physical Activity: Not on file  Stress: Not on file  Social Connections: Not on file  Intimate Partner Violence: Not on file    Family History  Problem Relation Age of Onset  . Arthritis Mother   . Diabetes Mother   . Hearing loss Mother   . Hyperlipidemia Mother   . Hypertension Mother   . Kidney disease Mother   . Stroke Mother   . Eating disorder Mother   . Obesity Mother   . Alcohol abuse Father   . Arthritis Father   . Asthma Father   . Cancer Father   . Depression Father   . Drug abuse Father   . Hearing loss Father   . Heart attack Father   . Heart disease Father   . Hyperlipidemia Father   . Hypertension Father   . Kidney disease Father   . Stroke Father   . Anxiety disorder Father   . Bipolar disorder Father   . Schizophrenia Father   . Liver disease Father   . Sleep apnea Father   . Obesity Father   . Stroke Sister   . Depression  Sister   . Diabetes Sister   . Hearing loss Sister   . Kidney disease Sister   . Learning disabilities Sister   . Mental illness Sister   . Asthma Daughter   . Arthritis Maternal Grandmother   . Cancer Maternal Grandmother   . Diabetes Maternal Grandmother   . Hypertension Maternal Grandmother   . Kidney disease Maternal Grandmother   . Stroke Maternal Grandmother   . Arthritis Maternal Grandfather   . Cancer Maternal Grandfather   . Heart attack Maternal Grandfather   . Arthritis Paternal Grandmother   . Alcohol abuse Paternal Grandmother   . Cancer Paternal Grandmother   . COPD Paternal Grandmother   . Diabetes Paternal Grandmother   . Heart attack Paternal Grandmother   . Hearing loss Paternal Grandmother   . Hypertension Paternal Grandmother   . Arthritis Paternal Grandfather   . Alcohol abuse Paternal Grandfather   . Cancer Paternal Grandfather   . COPD Paternal Grandfather   . Diabetes Paternal Grandfather   . Heart attack Paternal Grandfather   . Heart disease Paternal Grandfather   . Hearing loss Paternal Grandfather   . Hypertension Paternal Grandfather   . Stroke Paternal Grandfather   . Colon cancer Paternal Grandfather   . Birth defects Sister   . Esophageal cancer Neg Hx      Immunization History  Administered Date(s) Administered  . Influenza,inj,Quad PF,6+ Mos 07/27/2018, 12/17/2019  . Moderna Sars-Covid-2 Vaccination 02/11/2020, 05/13/2020  . Tdap 01/29/2018    Outpatient Encounter Medications as of 10/24/2020  Medication Sig  . albuterol (VENTOLIN HFA) 108 (90 Base) MCG/ACT inhaler Inhale 2 puffs into the lungs every 6 (six) hours as needed for wheezing or shortness of breath.  . EPINEPHrine 0.3 mg/0.3 mL IJ SOAJ injection Inject 0.3 mLs (0.3 mg total) into the muscle as needed for anaphylaxis.  Marland Kitchen FLUoxetine (PROZAC) 20 MG capsule Take by mouth.  . Galcanezumab-gnlm (EMGALITY) 120 MG/ML SOAJ INJECT 1 ML INTO THE SKIN EVERY 30 (THIRTY) DAYS.  Marland Kitchen  lamoTRIgine (LAMICTAL) 100 MG tablet Take 100 mg by mouth 2 (two) times daily.  Marland Kitchen lamoTRIgine (LAMICTAL) 25 MG  tablet Take by mouth.  . OLANZapine (ZYPREXA) 15 MG tablet Take 15 mg by mouth at bedtime.  . pantoprazole (PROTONIX) 40 MG tablet Take 1 tablet (40 mg total) by mouth daily.  . promethazine (PHENERGAN) 25 MG tablet Take 25 mg by mouth every 6 (six) hours as needed for nausea or vomiting.  Marland Kitchen UBRELVY 100 MG TABS Take by mouth.  . [DISCONTINUED] amphetamine-dextroamphetamine (ADDERALL XR) 30 MG 24 hr capsule Take 1 capsule (30 mg total) by mouth every morning. (Patient not taking: Reported on 10/24/2020)  . [DISCONTINUED] fluticasone furoate-vilanterol (BREO ELLIPTA) 100-25 MCG/INH AEPB Inhale 1 puff into the lungs daily. (Patient not taking: Reported on 10/24/2020)  . [DISCONTINUED] LATUDA 40 MG TABS tablet Take 1 tablet (40 mg total) by mouth at bedtime. (Patient not taking: Reported on 10/24/2020)  . [DISCONTINUED] NURTEC 75 MG TBDP 1 tablet as needed.  (Patient not taking: Reported on 10/24/2020)  . [DISCONTINUED] traZODone (DESYREL) 50 MG tablet Take 50 mg by mouth at bedtime as needed. for sleep (Patient not taking: Reported on 10/24/2020)  . [DISCONTINUED] Vitamin D, Ergocalciferol, (DRISDOL) 1.25 MG (50000 UNIT) CAPS capsule Take 1 capsule (50,000 Units total) by mouth every 7 (seven) days. (Patient not taking: Reported on 10/24/2020)   No facility-administered encounter medications on file as of 10/24/2020.     ROS: Pertinent positives and negatives noted in HPI. Remainder of ROS non-contributory    Allergies  Allergen Reactions  . Mushroom Extract Complex Shortness Of Breath and Swelling  . 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 Escitalopram Oxalate Rash  . Hydrocodone Other (See Comments)    Rebound headaches  . Latex Rash  . Morphine Other (See Comments)    Severe headaches Severe migraine   . Other Other  (See Comments)    All codeine medication increase migraine symptoms  . Sulfa Antibiotics Other (See Comments)    Causes glaucoma  VISION LOSS     BP 122/80   Pulse (!) 101   Temp 99 F (37.2 C) (Temporal)   Ht 6\' 1"  (1.854 m)   Wt (!) 308 lb 3.2 oz (139.8 kg)   SpO2 97%   BMI 40.66 kg/m    BP Readings from Last 3 Encounters:  10/24/20 122/80  04/25/20 117/82  04/21/20 102/70   Pulse Readings from Last 3 Encounters:  10/24/20 (!) 101  04/25/20 100  04/21/20 (!) 109   Wt Readings from Last 3 Encounters:  10/24/20 (!) 308 lb 3.2 oz (139.8 kg)  04/25/20 298 lb (135.2 kg)  04/21/20 (!) 304 lb 3.2 oz (138 kg)    Physical Exam Constitutional:      Appearance: Normal appearance. He is obese.  Cardiovascular:     Rate and Rhythm: Normal rate and regular rhythm.     Heart sounds: Normal heart sounds.  Pulmonary:     Effort: Pulmonary effort is normal. No respiratory distress.     Breath sounds: Normal breath sounds.  Musculoskeletal:     Left hand: Swelling and tenderness present.     Comments: Tenderness, erythema, and mild edema of Lt lateral nail bed; some scant drainage from area  Neurological:     Mental Status: He is alert and oriented to person, place, and time.      A/P:   1. Vitamin D deficiency - pt is taking 15,000IU every other day - VITAMIN D 25 Hydroxy (Vit-D Deficiency, Fractures)  2. Hyperlipidemia, unspecified  hyperlipidemia type - not on statin but very likely needs to be, especially since pt now with DM and A1C = 9.2 - will discuss once today's FLP result is available - Lipid panel - Comprehensive metabolic panel  3. Uncontrolled type 2 diabetes mellitus with hyperglycemia (HCC) - A1C = 9.2 (10/12/20), was 6.3 in 12/2019 Rx: - metFORMIN (GLUCOPHAGE XR) 500 MG 24 hr tablet; 1 tab daily x 1 week then 2 tabs po daily  Dispense: 180 tablet; Refill: 3 - discussed importance of low carb, low sugar diet - will discuss statin and ACE once lipid panel  and CMP results available - f/u in 8-12 wks or sooner PRN  4. Paronychia of left thumb - warm soak 2-3x/day Refill: - cephALEXin (KEFLEX) 500 MG capsule; Take 1 capsule (500 mg total) by mouth 2 (two) times daily for 10 days.  Dispense: 20 capsule; Refill: 0 - f/u if symptoms worsen or do not improve in 7-10 days Discussed plan and reviewed medications with patient, including risks, benefits, and potential side effects. Pt expressed understand. All questions answered.   This visit occurred during the SARS-CoV-2 public health emergency.  Safety protocols were in place, including screening questions prior to the visit, additional usage of staff PPE, and extensive cleaning of exam room while observing appropriate contact time as indicated for disinfecting solutions.

## 2020-10-25 LAB — LDL CHOLESTEROL, DIRECT: Direct LDL: 126 mg/dL

## 2020-10-25 LAB — LIPID PANEL
Cholesterol: 188 mg/dL (ref 0–200)
HDL: 42.8 mg/dL (ref >39.00)
NonHDL: 145.45
Total CHOL/HDL Ratio: 4
Triglycerides: 268 mg/dL — ABNORMAL HIGH (ref 0.0–149.0)
VLDL: 53.6 mg/dL — ABNORMAL HIGH (ref 0.0–40.0)

## 2020-10-25 LAB — COMPREHENSIVE METABOLIC PANEL
ALT: 76 U/L — ABNORMAL HIGH (ref 0–53)
AST: 75 U/L — ABNORMAL HIGH (ref 0–37)
Albumin: 4.6 g/dL (ref 3.5–5.2)
Alkaline Phosphatase: 100 U/L (ref 39–117)
BUN: 13 mg/dL (ref 6–23)
CO2: 24 mEq/L (ref 19–32)
Calcium: 9.8 mg/dL (ref 8.4–10.5)
Chloride: 102 mEq/L (ref 96–112)
Creatinine, Ser: 0.86 mg/dL (ref 0.40–1.50)
GFR: 108.85 mL/min (ref >60.00)
Glucose, Bld: 208 mg/dL — ABNORMAL HIGH (ref 70–99)
Potassium: 4 mEq/L (ref 3.5–5.1)
Sodium: 134 mEq/L — ABNORMAL LOW (ref 135–145)
Total Bilirubin: 0.5 mg/dL (ref 0.2–1.2)
Total Protein: 7 g/dL (ref 6.0–8.3)

## 2020-10-25 LAB — MICROALBUMIN / CREATININE URINE RATIO
Creatinine,U: 41.2 mg/dL
Microalb Creat Ratio: 1.7 mg/g (ref 0.0–30.0)
Microalb, Ur: <0.7 mg/dL (ref 0.0–1.9)

## 2020-10-25 LAB — VITAMIN D 25 HYDROXY (VIT D DEFICIENCY, FRACTURES): VITD: 31.49 ng/mL (ref 30.00–100.00)

## 2020-10-26 ENCOUNTER — Other Ambulatory Visit: Payer: Self-pay | Admitting: Family Medicine

## 2020-10-26 ENCOUNTER — Encounter: Payer: Self-pay | Admitting: Family Medicine

## 2020-10-26 DIAGNOSIS — R748 Abnormal levels of other serum enzymes: Secondary | ICD-10-CM

## 2020-10-26 DIAGNOSIS — E785 Hyperlipidemia, unspecified: Secondary | ICD-10-CM

## 2020-10-26 DIAGNOSIS — E1165 Type 2 diabetes mellitus with hyperglycemia: Secondary | ICD-10-CM

## 2020-10-26 MED ORDER — ROSUVASTATIN CALCIUM 10 MG PO TABS: 10.0000 mg | ORAL_TABLET | Freq: Every day | ORAL | 3 refills | Status: AC

## 2020-11-02 DIAGNOSIS — E119 Type 2 diabetes mellitus without complications: Secondary | ICD-10-CM | POA: Diagnosis not present

## 2020-11-02 DIAGNOSIS — E782 Mixed hyperlipidemia: Secondary | ICD-10-CM | POA: Diagnosis not present

## 2020-11-02 DIAGNOSIS — Z713 Dietary counseling and surveillance: Secondary | ICD-10-CM | POA: Diagnosis not present

## 2020-11-02 DIAGNOSIS — E6609 Other obesity due to excess calories: Secondary | ICD-10-CM | POA: Diagnosis not present

## 2020-11-09 DIAGNOSIS — G4719 Other hypersomnia: Secondary | ICD-10-CM | POA: Diagnosis not present

## 2020-11-09 DIAGNOSIS — G4733 Obstructive sleep apnea (adult) (pediatric): Secondary | ICD-10-CM | POA: Diagnosis not present

## 2020-11-15 DIAGNOSIS — G4719 Other hypersomnia: Secondary | ICD-10-CM | POA: Diagnosis not present

## 2020-11-15 DIAGNOSIS — Z9989 Dependence on other enabling machines and devices: Secondary | ICD-10-CM | POA: Diagnosis not present

## 2020-11-15 DIAGNOSIS — G4733 Obstructive sleep apnea (adult) (pediatric): Secondary | ICD-10-CM | POA: Diagnosis not present

## 2020-11-21 DIAGNOSIS — Z713 Dietary counseling and surveillance: Secondary | ICD-10-CM | POA: Diagnosis not present

## 2020-11-21 DIAGNOSIS — K760 Fatty (change of) liver, not elsewhere classified: Secondary | ICD-10-CM | POA: Diagnosis not present

## 2020-11-21 DIAGNOSIS — E119 Type 2 diabetes mellitus without complications: Secondary | ICD-10-CM | POA: Diagnosis not present

## 2020-11-21 DIAGNOSIS — E6609 Other obesity due to excess calories: Secondary | ICD-10-CM | POA: Diagnosis not present

## 2020-12-26 DIAGNOSIS — Z9989 Dependence on other enabling machines and devices: Secondary | ICD-10-CM | POA: Diagnosis not present

## 2020-12-26 DIAGNOSIS — G43719 Chronic migraine without aura, intractable, without status migrainosus: Secondary | ICD-10-CM | POA: Diagnosis not present

## 2020-12-26 DIAGNOSIS — F3181 Bipolar II disorder: Secondary | ICD-10-CM | POA: Diagnosis not present

## 2020-12-26 DIAGNOSIS — G4733 Obstructive sleep apnea (adult) (pediatric): Secondary | ICD-10-CM | POA: Diagnosis not present

## 2021-01-12 ENCOUNTER — Telehealth: Payer: Self-pay | Admitting: Family Medicine

## 2021-01-12 NOTE — Telephone Encounter (Signed)
Pt has moved to [place]. I removed Dr. Bryan Lemma as PCP & pt aware he'll need to sign medical release from new provider and have sent to [place] when needed.

## 2021-01-17 ENCOUNTER — Other Ambulatory Visit: Payer: Self-pay | Admitting: Family Medicine

## 2021-01-24 ENCOUNTER — Ambulatory Visit: Payer: Medicare Other | Admitting: Family Medicine

## 2022-05-15 ENCOUNTER — Encounter (INDEPENDENT_AMBULATORY_CARE_PROVIDER_SITE_OTHER): Payer: Self-pay
